# Patient Record
Sex: Female | Born: 1955 | Race: White | Hispanic: No | Marital: Married
Health system: Midwestern US, Academic
[De-identification: ages and names within clinical notes are randomized; demographics above are authoritative.]

---

## 2021-05-21 ENCOUNTER — Encounter: Admit: 2021-05-21 | Discharge: 2021-05-21 | Payer: MEDICARE

## 2021-05-22 ENCOUNTER — Encounter: Admit: 2021-05-22 | Discharge: 2021-05-22 | Payer: MEDICARE

## 2021-05-22 DIAGNOSIS — D649 Anemia, unspecified: Secondary | ICD-10-CM

## 2021-05-22 DIAGNOSIS — R161 Splenomegaly, not elsewhere classified: Secondary | ICD-10-CM

## 2021-05-22 DIAGNOSIS — D72829 Elevated white blood cell count, unspecified: Secondary | ICD-10-CM

## 2021-05-22 DIAGNOSIS — D61818 Other pancytopenia: Secondary | ICD-10-CM

## 2021-05-22 DIAGNOSIS — R634 Abnormal weight loss: Secondary | ICD-10-CM

## 2021-05-22 DIAGNOSIS — R61 Generalized hyperhidrosis: Secondary | ICD-10-CM

## 2021-05-22 NOTE — Telephone Encounter
Orders for CBC + diff, peripheral smear and BCR/ABL p210 faxed to [facility] lab. Fax [phone]. Received successful completion report.     Later received message from lab that they need a form signed by provider and faxed back to process the peripheral smear. This will be given to provider on Monday 05/25/21 as he is out of office today and they are aware.

## 2021-05-25 ENCOUNTER — Encounter: Admit: 2021-05-25 | Discharge: 2021-05-25 | Payer: MEDICARE

## 2021-05-25 DIAGNOSIS — D61818 Other pancytopenia: Secondary | ICD-10-CM

## 2021-05-25 DIAGNOSIS — D7589 Other specified diseases of blood and blood-forming organs: Secondary | ICD-10-CM

## 2021-05-25 DIAGNOSIS — R61 Generalized hyperhidrosis: Secondary | ICD-10-CM

## 2021-05-25 DIAGNOSIS — R161 Splenomegaly, not elsewhere classified: Secondary | ICD-10-CM

## 2021-05-25 DIAGNOSIS — R69 Illness, unspecified: Secondary | ICD-10-CM

## 2021-05-25 DIAGNOSIS — R634 Abnormal weight loss: Secondary | ICD-10-CM

## 2021-05-25 NOTE — Telephone Encounter
05/22/21- Per Dr Jacqualyn Posey I asked pt if she would be agreeable to drawing labs before coming to [place]. She was agreeable and requested they be sent to [facility]. Dawn Brenson ordered cbc, peripheral smear and bcr abl per Dr Jacqualyn Posey and pt had them drawn on 05/22/21   05/25/21: Per Dr Jacqualyn Posey I called [place] and spoke to [facility] lab tec and asked if BCR ABL could be canceled. She reported she called [facility] and they refused to cancel the lab. I called [facility] and spoke to a representative at [facility] who confirmed they can not canceled due to test being started on 7/10.

## 2021-05-26 ENCOUNTER — Encounter: Admit: 2021-05-26 | Discharge: 2021-05-26 | Payer: MEDICARE

## 2021-05-27 ENCOUNTER — Encounter: Admit: 2021-05-27 | Discharge: 2021-05-27 | Payer: MEDICARE

## 2021-05-27 DIAGNOSIS — D72829 Elevated white blood cell count, unspecified: Secondary | ICD-10-CM

## 2021-05-27 DIAGNOSIS — D61818 Other pancytopenia: Secondary | ICD-10-CM

## 2021-05-27 DIAGNOSIS — R61 Generalized hyperhidrosis: Secondary | ICD-10-CM

## 2021-05-27 DIAGNOSIS — R161 Splenomegaly, not elsewhere classified: Secondary | ICD-10-CM

## 2021-05-27 DIAGNOSIS — R634 Abnormal weight loss: Secondary | ICD-10-CM

## 2021-05-27 DIAGNOSIS — D649 Anemia, unspecified: Secondary | ICD-10-CM

## 2021-05-28 ENCOUNTER — Encounter: Admit: 2021-05-28 | Discharge: 2021-05-28 | Payer: MEDICARE

## 2021-05-28 DIAGNOSIS — R634 Abnormal weight loss: Secondary | ICD-10-CM

## 2021-05-28 DIAGNOSIS — D61818 Other pancytopenia: Secondary | ICD-10-CM

## 2021-05-28 DIAGNOSIS — D649 Anemia, unspecified: Secondary | ICD-10-CM

## 2021-05-28 DIAGNOSIS — R161 Splenomegaly, not elsewhere classified: Secondary | ICD-10-CM

## 2021-05-28 DIAGNOSIS — D72829 Elevated white blood cell count, unspecified: Secondary | ICD-10-CM

## 2021-05-28 DIAGNOSIS — R61 Generalized hyperhidrosis: Secondary | ICD-10-CM

## 2021-05-29 ENCOUNTER — Encounter: Admit: 2021-05-29 | Discharge: 2021-05-29 | Payer: MEDICARE

## 2021-05-29 DIAGNOSIS — R161 Splenomegaly, not elsewhere classified: Secondary | ICD-10-CM

## 2021-05-29 DIAGNOSIS — R61 Generalized hyperhidrosis: Secondary | ICD-10-CM

## 2021-05-29 DIAGNOSIS — R634 Abnormal weight loss: Secondary | ICD-10-CM

## 2021-05-29 DIAGNOSIS — D72829 Elevated white blood cell count, unspecified: Secondary | ICD-10-CM

## 2021-05-29 DIAGNOSIS — D61818 Other pancytopenia: Secondary | ICD-10-CM

## 2021-05-29 DIAGNOSIS — D649 Anemia, unspecified: Secondary | ICD-10-CM

## 2021-05-31 NOTE — Progress Notes
Name: Crystal Cameron          MRN: [id number]      DOB: [birth year]      AGE: 65 y.o.   DATE OF SERVICE: 06/03/2021    Subjective:             Reason for Visit:  No chief complaint on file.      Crystal Cameron is a 65 y.o. female.       Crystal Cameron is here for consultation regarding her splenomegaly and cytopenia.    Crystal Cameron is here with her husband Crystal Cameron.  They had noticed when he put her arm across her abdomen that he thought he felt a mass.  They gone to see the doctor.  I CAT scan in July 1 showed the spleen of 620 x 19 cm.  The liver looked normal.  There is no adenopathy.  Lab work about that same time showed a hemoglobin 11.2, platelets of 99, and ANC of 2300 and lymphocytopenia and a white count of 2.8.  The patient has been trying to lose weight and maybe lost 10 pounds in the last 4 months.  She does have some sweats at night but that does not psych that is necessarily new.  She is not aware of any skin rashes.    The patient has a history of skin cancer and also generalized anxiety disorder.    The patient is originally from around [facility].  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    Explained my concern to the patient and her husband that this might represent a lymphoproliferative sorter or a myeloproliferative disorder.  I suggested we arrange for a bone marrow biopsy with conscious sedation.  The patient's mother died from T-cell lymphoma at an older age.  She also had another family member that had Hodgkin's lymphoma at an older age.    The patient's past medical, surgical, social, and family histories were reviewed with the patient at this visit. I also reviewed the recent laboratory, pathology, and radiological studies.    We will arrange for a bone marrow biopsy with conscious sedation here at [place].  We will then get back together about 3 weeks after that.  They do not have the best cell service and we told her we will try to call them the results of the test as they trickle and.  Answered a number of other questions for the patient and her husband.           Review of Systems   Constitutional: Positive for diaphoresis, fatigue and unexpected weight change. Negative for chills and fever.   HENT: Negative for nosebleeds.    Eyes: Negative for pain and visual disturbance.   Respiratory: Negative for cough and shortness of breath.    Cardiovascular: Negative for chest pain and leg swelling.   Gastrointestinal: Negative for abdominal pain.   Genitourinary: Negative for hematuria.   Musculoskeletal: Negative for joint swelling and neck pain.   Skin: Negative for rash.   Neurological: Negative for dizziness and weakness.   Hematological: Negative for adenopathy.   Psychiatric/Behavioral: Negative for confusion.         Objective:           No current outpatient medications on file.       There were no vitals filed for this visit.  There  is no height or weight on file to calculate BMI.     No past medical history on file.  No past surgical history on file.      No family history on file.                   Pain Addressed:  N/A    Patient Evaluated for a Clinical Trial: No treatment clinical trial available for this patient.     [place] Cooperative Oncology Group performance status is 0, Fully active, able to carry on all pre-disease performance without restriction.Marland Kitchen     Physical Exam  Vitals reviewed.   Constitutional:       General: She is not in acute distress.     Appearance: Normal appearance. She is well-developed and normal weight. She is not ill-appearing.   HENT:      Head: Normocephalic and atraumatic.   Eyes:      Conjunctiva/sclera: Conjunctivae normal.   Neck:      Vascular: No JVD.      Trachea: No tracheal deviation.   Cardiovascular:      Rate and Rhythm: Normal rate and regular rhythm.      Heart sounds:     No friction rub. No gallop.   Pulmonary:      Effort: Pulmonary effort is normal. No respiratory distress.      Breath sounds: Normal breath sounds. No stridor. No wheezing or rales.   Chest:   Breasts:      Right: No supraclavicular adenopathy.      Left: No supraclavicular adenopathy.       Abdominal:      General: There is no distension.      Palpations: Abdomen is soft. There is mass (Spleen).      Tenderness: There is no abdominal tenderness.       Musculoskeletal:         General: Normal range of motion.      Cervical back: Normal range of motion. No rigidity.      Right lower leg: No edema.      Left lower leg: No edema.   Lymphadenopathy:      Head:      Right side of head: No submental or submandibular adenopathy.      Left side of head: No submental or submandibular adenopathy.      Cervical: No cervical adenopathy.      Right cervical: No superficial or posterior cervical adenopathy.     Left cervical: No superficial or posterior cervical adenopathy.      Upper Body:      Right upper body: No supraclavicular or epitrochlear adenopathy.      Left upper body: No supraclavicular or epitrochlear adenopathy.   Skin:     General: Skin is warm and dry.      Findings: No bruising, lesion or rash.   Neurological:      Mental Status: She is alert and oriented to person, place, and time.      Cranial Nerves: No cranial nerve deficit.      Motor: No weakness.      Gait: Gait normal.   Psychiatric:         Mood and Affect: Mood normal.         Behavior: Behavior normal.         Thought Content: Thought content normal.         Judgment: Judgment normal.  Assessment and Plan:  Patient Active Problem List    Diagnosis Date Noted   ? Splenomegaly 05/26/2021     Priority: Medium     05/14/2021 [facility] laboratory creatinine 1.1, transaminases alk phos total bili normal, TSH 1.1, W2.8 low, H11.2 Below 12, MCV 82.1, platelets 99 low, ANC 2300, lymphocytes 300 slightly low,  05/15/2021 [facility] in [place] CT abdomen pelvis liver is normal sign.  Spleen is massively enlarged measuring 20 cm x 19 cm.  No focal splenic lesions.  No adenopathy.  Diverticulosis throughout the sigmoid colon.  05/20/2021 [facility] medical clinic, [place].  65 year old female to review CAT scan that showed massive splenomegaly.  Lab work showed pancytopenia.  Patient says she has lost weight maybe 55 pounds since last fall.  Also reports weight loss and night sweats.  05/20/2021 [facility] BCR/ABL 1 and 2 not detected  05/22/2021 osl showed W2.3, H10.5, MCV 82, platelets 129, MPV 11.2 slightly elevated, 78% neutrophils  05/29/2021 [facility] peripheral smear pancytopenia.  06/03/2021 this is patient's first visit to [facility] office.  Reviewed imaging showing enlarged spleen along with exam feeling palpable spleen.  Talked about peripheral smear not being helpful.  We will arrange for bone marrow biopsy with conscious sedation and follow-up about 3 weeks later.  She is aware that I am concerned about possible lymphoproliferative sorter or myeloproliferative disorder.  Her mother died from a T-cell lymphoma at an older age.  Her mother also had melanoma.      PMH: Generalized anxiety disorder    SH: Patient and husband both from the same small town I believe at [facility].  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                                       ? Zoster without complications 05/26/2021   ? Other specified anxiety disorders 05/26/2021     This note was created with partial dictation using a dragon dictation system, please notify us if you notice any errors of omissions or content.

## 2021-06-03 ENCOUNTER — Encounter: Admit: 2021-06-03 | Discharge: 2021-06-03 | Payer: MEDICARE

## 2021-06-03 DIAGNOSIS — H547 Unspecified visual loss: Secondary | ICD-10-CM

## 2021-06-03 DIAGNOSIS — C801 Malignant (primary) neoplasm, unspecified: Secondary | ICD-10-CM

## 2021-06-03 DIAGNOSIS — R161 Splenomegaly, not elsewhere classified: Secondary | ICD-10-CM

## 2021-06-03 DIAGNOSIS — D759 Disease of blood and blood-forming organs, unspecified: Secondary | ICD-10-CM

## 2021-06-03 DIAGNOSIS — C4490 Unspecified malignant neoplasm of skin, unspecified: Secondary | ICD-10-CM

## 2021-06-04 ENCOUNTER — Encounter: Admit: 2021-06-04 | Discharge: 2021-06-04 | Payer: MEDICARE

## 2021-06-04 DIAGNOSIS — C801 Malignant (primary) neoplasm, unspecified: Secondary | ICD-10-CM

## 2021-06-04 DIAGNOSIS — H547 Unspecified visual loss: Secondary | ICD-10-CM

## 2021-06-04 DIAGNOSIS — C4490 Unspecified malignant neoplasm of skin, unspecified: Secondary | ICD-10-CM

## 2021-06-04 NOTE — Patient Education
Dear Mrs Kathlene November,    Thank you for choosing [facility] Interventional Radiology for your procedure. Your appointment information is listed below:    Appointment Date: 06/09/21  Appointment Time: 10AM  Arrival Time: 9AM  Location:     ? [facility]: [address], [place], [place] [address]   Parking: available in the front of the building      INTERVENTIONAL RADIOLOGY  PRE-PROCEDURE INSTRUCTIONS SEDATION    You are scheduled for a procedure in Interventional Radiology with procedural sedation.  Please follow these instructions and any direction from your Primary Care/Managing Physician.  If you have questions about your procedure or need to reschedule please call [phone].    Medication Instructions:   You may take the following medications with a small sip of water:  Continue taking your normal morning medications as directed.        Diet Instructions:  a. (8) hours 2am before your procedure, stop your regular diet and start a clear liquid diet.  b. (2) hours 8am before your procedure discontinue clear liquids.  You should have nothing by mouth. This includes GUM or CANDY.     Clear Liquid Diet    Water  Apple or White Grape Juice  Coffee or tea without cream   Tea  White Cranberry Juice  Chicken Bouillon or Broth (no noodles)  Soda Pop  Popsicles   Beef Bouillon or Broth (no noodles)    Day of Exam Instructions:  1. Bathe or shower with an antibacterial soap prior to your appointment.  2. If you have a history of Obstructive Sleep Apnea (OSA) bring your CPAP/BIPAP.   3. Bring a list of your current medications and the dosages.  4. Wear comfortable clothing and leave valuables at home.  5. Arrive (1) hour prior to your appointment.  This time will be spent registering, interviewing, assessing, educating and preparing you for the test.  ? You will be with [place] anywhere from 30 minutes to 6 hours after your exam depending on your procedure.  6. You will be sedated for the procedure. A responsible adult must drive you home (no Benedetto Goad, taxis or buses are allowed) and stay with you overnight. If you do not have a driver we will be unable to perform your procedure.   7. You will not be able to return to work or drive the same day if receiving sedation.

## 2021-06-04 NOTE — Progress Notes
Interventional Radiology Outpatient Scheduling Checklist      1.  Name of Procedure(s):   Bone Marrow Biopsy      2.  Date of Procedure:   06/09/21      3.  Arrival Time:   9am      4.  Procedure Time:  10am      5.  Correct Procedural Room Assignment:  [facility]      6.  Blood Thinners Triaged and instructed per protocol: Y/N/NA:  NA  Confirmed accurate instructions sent to patient: Y/N:  NA       7.  Procedure Order Verified: Y/N:  Yes      9.  Patient instructed to have a driver: Y/N/NA:  Yes    10.  Patient instructed on NPO status: Y/N/NA:  Yes  Confirmed accurate instructions sent to patient: Y/N:  Yes    11.  Specimen needed: Y/N/NA:  Yes   Verified Order placed: Y/N:  Yes    12.  Allergies Verified:  Y/N:  Yes    13.  Is there an Iodine Allergy: Y/N:  No  Does the Procedure Require contrast: Y/N:  N  If so, was the IR- Contrast Allergy Pre-Procedure Medication protocol ordered: Y/NA:  NA    14.  Does the patient have labs according to IR Pre-procedure Laboratory Parameter policy: Y/N/NA:  Yes  If No, was the patient instructed to obtain labs prior to procedure: Y/N/NA:  NA     15.  Will the patient need to be admitted or have a possible admission: Y/N:  No  If yes, confirmed accurate instructions sent to patient: Y/N/NA:  NA     16.  Patient States Understanding: Y/N:  Yes    17.  History of OSA:  Y/N:  No  If yes, confirm request to bring CPAP sent to patient: Y/N/NA:  NA    18. Does the patient have an insulin pump or continuous glucose monitor? NA   If yes, was the patient instructed that this will need to be removed for this procedure and to bring supplies to reapply once the procedure is complete? Y/N    19. Patient was sent electronic procedure instructions: Y/N:  Yes

## 2021-06-05 ENCOUNTER — Encounter: Admit: 2021-06-05 | Discharge: 2021-06-05 | Payer: MEDICARE

## 2021-06-06 NOTE — Telephone Encounter
Patient called to let us know she is scheduled on Tuesday 06/09/21 at 0900 for bone marrow biopsy so thinks we are on track.

## 2021-06-09 ENCOUNTER — Encounter: Admit: 2021-06-09 | Discharge: 2021-06-09 | Payer: MEDICARE

## 2021-06-09 ENCOUNTER — Ambulatory Visit: Admit: 2021-06-09 | Discharge: 2021-06-09 | Payer: MEDICARE

## 2021-06-09 DIAGNOSIS — D759 Disease of blood and blood-forming organs, unspecified: Secondary | ICD-10-CM

## 2021-06-09 DIAGNOSIS — C801 Malignant (primary) neoplasm, unspecified: Secondary | ICD-10-CM

## 2021-06-09 DIAGNOSIS — H547 Unspecified visual loss: Secondary | ICD-10-CM

## 2021-06-09 DIAGNOSIS — R161 Splenomegaly, not elsewhere classified: Principal | ICD-10-CM

## 2021-06-09 DIAGNOSIS — C4490 Unspecified malignant neoplasm of skin, unspecified: Secondary | ICD-10-CM

## 2021-06-09 LAB — MANUAL DIFF
EOSINOPHIL %: 3 % (ref 0–5)
LYMPHOCYTES: 22 % — ABNORMAL LOW (ref 24–44)
MONOCYTES %: 2 % — ABNORMAL LOW (ref 4–12)
NEUTROPHILS,SEG: 73 % — ABNORMAL LOW (ref 41–77)

## 2021-06-09 LAB — CBC W/DIFF BM
HEMATOCRIT: 32 % — ABNORMAL LOW (ref 36–45)
HEMOGLOBIN: 10 g/dL — ABNORMAL LOW (ref 12.0–15.0)
MCH: 25 pg — ABNORMAL LOW (ref 26–34)
MCHC: 32 g/dL (ref 32.0–36.0)
MCV: 78 FL — ABNORMAL LOW (ref 80–100)
MPV: 9.4 FL (ref 7–11)
PLATELET COUNT: 85 K/UL — ABNORMAL LOW (ref 150–400)
RBC COUNT: 4 M/UL (ref 4.0–5.0)
RDW: 17 % — ABNORMAL HIGH (ref 11–15)
WBC COUNT: 1.8 K/UL — ABNORMAL LOW (ref 4.5–11.0)

## 2021-06-09 MED ORDER — SODIUM CHLORIDE 0.9 % IV SOLP
0 refills | Status: CP
Start: 2021-06-09 — End: ?
  Administered 2021-06-09: 15:00:00 500 mL via INTRAVENOUS

## 2021-06-09 MED ORDER — ONDANSETRON HCL (PF) 4 MG/2 ML IJ SOLN
4 mg | Freq: Once | INTRAVENOUS | 0 refills | Status: CP
Start: 2021-06-09 — End: ?
  Administered 2021-06-09: 15:00:00 4 mg via INTRAVENOUS

## 2021-06-09 MED ORDER — MIDAZOLAM 1 MG/ML IJ SOLN
1 mg | Freq: Once | INTRAVENOUS | 0 refills | Status: CP
Start: 2021-06-09 — End: ?
  Administered 2021-06-09: 15:00:00 1 mg via INTRAVENOUS

## 2021-06-09 MED ORDER — PROCHLORPERAZINE EDISYLATE 5 MG/ML IJ SOLN
10 mg | Freq: Once | INTRAVENOUS | 0 refills | Status: CP
Start: 2021-06-09 — End: ?
  Administered 2021-06-09: 16:00:00 10 mg via INTRAVENOUS

## 2021-06-09 MED ORDER — FENTANYL CITRATE (PF) 50 MCG/ML IJ SOLN
0 refills | Status: CP
Start: 2021-06-09 — End: ?
  Administered 2021-06-09: 15:00:00 50 ug via INTRAVENOUS

## 2021-06-09 MED ORDER — MIDAZOLAM 1 MG/ML IJ SOLN
0 refills | Status: CP
Start: 2021-06-09 — End: ?
  Administered 2021-06-09: 15:00:00 1 mg via INTRAVENOUS

## 2021-06-09 NOTE — Other
Immediate Post Procedure Note    Date:  06/09/2021                                         Attending Physician:   Derrek Gu    Procedure(s):  Bone marrow biopsy  Pre/Post Diagnosis:  cytopenia  Description/Findings:  Core specimens  Anesthesia:  2% lidocaine  Versed IV, fentanyl IV    Time out performed: Consent obtained, correct patient verified, correct procedure verified, correct site verified, patient marked as necessary.  Estimated Blood Loss:  None/Negligible  Specimen(s) Removed/Disposition:  Yes, sent to pathology  Complications: None    Kathlynn Grate, MD

## 2021-06-09 NOTE — Progress Notes
Sedation physician present in room.  Recent vitals and patient condition reviewed between sedating physician and nurse.  Reassessment completed.  Determination made to proceed with planned sedation.

## 2021-06-09 NOTE — H&P (View-Only)
Pre Procedure History and Physical/Sedation Plan    Procedure Date: 06/09/2021     Planned Procedure(s):  Bone marrow biopsy  Indication:  cytopenia  Sedation/Medication Plan: Moderate sedation  Discussion/Reviews:  Physician has discussed risks and alternatives of this type of sedation and above planned procedures with patient  Notes:  none  __________________________________________________________________    Active Problems:    * No active hospital problems. *      Chief Complaint:  Cytopenia    History of Present Illness: Crystal Cameron is a 65 y.o. female with cytopenia    Previous Anesthetic/Sedation History:  Denies previous adverse events    Nursing Medical History     Nursing Surgical History     Pertinent medical/surgical history reviewed  Social History     Socioeconomic History    Marital status: Married   Tobacco Use    Smoking status: Never Smoker    Smokeless tobacco: Never Used   Brewing technologist Use: Never used   Substance and Sexual Activity    Alcohol use: Never    Drug use: Never     Family history reviewed; non-contributory  Allergies:  Vicodin [hydrocodone-acetaminophen]  Medications:  No current facility-administered medications for this encounter.     Review of Systems:  Pertinent as noted per HPI    Physical Exam:  Temp: 37 C (98.6 F) (07/26 0926)  Pulse: 79 (07/26 0926)  Respirations: 16 PER MINUTE (07/26 0926)  BP: 133/79 (07/26 0926)  General:  Alert, cooperative, no distress, appears stated age    Airway:  airway assessment performed  Mallampati II (soft palate, uvula, fauces visible)  Anesthesia Classification:  ASA II (A normal patient with mild systemic disease)    Lab/Radiology/Other Diagnostic Tests:  Labs:  Pertinent labs reviewed  CXR:  Not obtained  Consults: Not obtained    Kathlynn Grate, MD

## 2021-06-12 ENCOUNTER — Encounter: Admit: 2021-06-12 | Discharge: 2021-06-12 | Payer: MEDICARE

## 2021-06-12 DIAGNOSIS — C8580 Other specified types of non-Hodgkin lymphoma, unspecified site: Secondary | ICD-10-CM

## 2021-06-12 DIAGNOSIS — R161 Splenomegaly, not elsewhere classified: Secondary | ICD-10-CM

## 2021-06-12 DIAGNOSIS — D61818 Other pancytopenia: Secondary | ICD-10-CM

## 2021-06-12 DIAGNOSIS — C8597 Non-Hodgkin lymphoma, unspecified, spleen: Secondary | ICD-10-CM

## 2021-06-12 NOTE — Telephone Encounter
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    I have suggested that we arrange for a PET scan to see the extent of her disease.    I talked about potential treatments with Bendamustine rituximab given every 4 weeks for 6 cycles.  I also talked about the possibility rituximab single agent.  I also mentioned the possibility of a B TK inhibitor.    I suggested to the patient that we arrange for the PET scan.  I will also talk with my partners next week about the current treatment of splenic marginal zone lymphoma.  We are trying to balance the desire to improve her blood counts so that her risk for infection will be decreased.    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  We talked about the risk for infection.  She is fully vaccinated and boosted.  She may not substitute beginning of year to we see how her counts improve.  We will let her know the results of PET scan and may talk by phone about teaching for appropriate therapy.  She will write down and ask [place] questions if they arise.

## 2021-06-17 ENCOUNTER — Encounter: Admit: 2021-06-17 | Discharge: 2021-06-17 | Payer: MEDICARE

## 2021-06-17 NOTE — Telephone Encounter
Patient called to see if she needs to come for her follow up 06/25/2021 since Dr Jacqualyn Posey called her with her bone marrow biopsy results. Dr Jacqualyn Posey agreed to cancel that appointment since we are waiting for her PET results 07/01/2021. Patient requests that her PET be moved up if at all possible. Will have scheduling look into moving the PET up if possible. Patient appreciative.

## 2021-06-25 ENCOUNTER — Encounter: Admit: 2021-06-25 | Discharge: 2021-06-25 | Payer: MEDICARE

## 2021-06-25 DIAGNOSIS — C8597 Non-Hodgkin lymphoma, unspecified, spleen: Secondary | ICD-10-CM

## 2021-06-25 DIAGNOSIS — C8580 Other specified types of non-Hodgkin lymphoma, unspecified site: Secondary | ICD-10-CM

## 2021-06-25 DIAGNOSIS — R161 Splenomegaly, not elsewhere classified: Secondary | ICD-10-CM

## 2021-06-25 LAB — POC GLUCOSE: POC GLUCOSE: 96 mg/dL (ref 70–100)

## 2021-06-25 MED ORDER — RP DX F-18 FDG MCI
15 | Freq: Once | INTRAVENOUS | 0 refills | Status: CP
Start: 2021-06-25 — End: ?
  Administered 2021-06-25: 16:00:00 17.9 via INTRAVENOUS

## 2021-06-26 ENCOUNTER — Encounter: Admit: 2021-06-26 | Discharge: 2021-06-26 | Payer: MEDICARE

## 2021-06-26 DIAGNOSIS — D759 Disease of blood and blood-forming organs, unspecified: Secondary | ICD-10-CM

## 2021-06-26 DIAGNOSIS — C8580 Other specified types of non-Hodgkin lymphoma, unspecified site: Secondary | ICD-10-CM

## 2021-06-26 DIAGNOSIS — R161 Splenomegaly, not elsewhere classified: Secondary | ICD-10-CM

## 2021-06-26 NOTE — Telephone Encounter
Lab orders faxed to [facility] clinic. Fax [phone]. Received confirmation of successful fax transmission. RN contacted [place] to let her know orders sent and to contact the medical clinic to set up lab draw. V/u. RN contacted [facility], confirmed they had received lab orders.

## 2021-07-02 ENCOUNTER — Encounter: Admit: 2021-07-02 | Discharge: 2021-07-02 | Payer: MEDICARE

## 2021-07-02 DIAGNOSIS — R161 Splenomegaly, not elsewhere classified: Secondary | ICD-10-CM

## 2021-07-02 DIAGNOSIS — D759 Disease of blood and blood-forming organs, unspecified: Secondary | ICD-10-CM

## 2021-07-02 DIAGNOSIS — C8580 Other specified types of non-Hodgkin lymphoma, unspecified site: Secondary | ICD-10-CM

## 2021-07-03 ENCOUNTER — Encounter: Admit: 2021-07-03 | Discharge: 2021-07-03 | Payer: MEDICARE

## 2021-07-03 NOTE — Telephone Encounter
Patient called to check to see if we had received results of her hepatitis testing. All tests have been reviewed and are negative. Notified Makiyla of results and that treatment plan would be entered, once cleared by insurance, will be scheduled for education and treatment. She notes she is having fatigue, and is anxious to get treatment started.

## 2021-07-08 ENCOUNTER — Encounter: Admit: 2021-07-08 | Discharge: 2021-07-08 | Payer: MEDICARE

## 2021-07-08 MED ORDER — ACETAMINOPHEN 325 MG PO TAB
650 mg | Freq: Once | ORAL | 0 refills
Start: 2021-07-08 — End: ?

## 2021-07-08 MED ORDER — RITUXIMAB-PVVR IVPB
375 mg/m2 | Freq: Once | INTRAVENOUS | 0 refills
Start: 2021-07-08 — End: ?

## 2021-07-08 MED ORDER — DIPHENHYDRAMINE HCL 25 MG PO CAP
25 mg | Freq: Once | ORAL | 0 refills
Start: 2021-07-08 — End: ?

## 2021-07-08 MED ORDER — MEPERIDINE (PF) 25 MG/ML IJ SYRG
25 mg | INTRAVENOUS | 0 refills | PRN
Start: 2021-07-08 — End: ?

## 2021-07-08 NOTE — Progress Notes
this patient has marginal zone lymphoma    Patient Active Problem List    Diagnosis Date Noted   ? 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  Spleen is massively enlarged measuring 20 cm x 19 cm.  No focal splenic lesions.  No adenopathy.  Diverticulosis throughout the sigmoid colon.  05/20/2021 [facility] medical clinic, [place].  65 year old female to review CAT scan that showed massive splenomegaly.  Lab work showed pancytopenia.  Patient says she has lost weight maybe 55 pounds since last fall.  Also reports weight loss and night sweats.  05/20/2021 [facility] BCR/ABL 1 and 2 not detected  05/22/2021 osl showed W2.3, H10.5, MCV 82, platelets 129, MPV 11.2 slightly elevated, 78% neutrophils  05/29/2021 [facility] peripheral smear pancytopenia.  06/03/2021 this is patient's first visit to [facility] office.  Reviewed imaging showing enlarged spleen along with exam feeling palpable spleen.  Talked about peripheral smear not being helpful.  We will arrange for bone marrow biopsy with conscious sedation and follow-up about 3 weeks later.  She is aware that I am concerned about possible lymphoproliferative sorter or myeloproliferative disorder.  Her mother died from a T-cell lymphoma at an older age.  Her mother also had melanoma.  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  See comment.  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 - - - - consider PET and treatment with BR  06/12/2021 called and talked with patient about results of bone marrow showing marginal zone lymphoma.  Mention plans for PET scan.  Consider Bendamustine rituximab given every 4 weeks x 6 cycles.  Talked about risk for infection due to marginal zone lymphoma and pancytopenia but also risk of infection with both rituximab lymphocyte depletion as well as potential neutropenia with Bendamustine.  We will also do more think about whether single agent rituximab versus BR versus B TK inhibitor would be appropriate.  We will call patient after PET scan and arrange for teaching of appropriate therapy.  Patient will ask questions.  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  06/25/2021 [facility] PET scan spleen a megaly with diffuse FDG uptake maximal SUV of 6.6 measuring 21 cm previously 20 cm.  No hypermetabolic lymphadenopathy.  06/25/2021 tried reaching patient.  No answer.  We will check to see if she has had HCV testing.  We will also check HBV testing.  If HCV positive might consider antiretroviral therapy.  If negative would consider rituximab single agent.  06/30/2021 [facility] hepatitis C virus antibody 0.2, negative.  Hepatitis B surface antigen screen negative, hepatitis B core antibody screen negative  07/06/2021 orders entered for Bendamustine rituximab with plans for 6 cycles depending upon tolerance and counts.    PMH: Generalized anxiety disorder    SH: Patient and husband both from the same small town I believe at [facility].  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                                       ?  Pancytopenia (HCC) 06/12/2021     Priority: Low     06/12/2021 likely related to bone marrow involvement by marginal zone lymphoma.     ? Zoster without complications 05/26/2021   ? Other specified anxiety disorders 05/26/2021     This note was created with partial dictation using a dragon dictation system, please notify us if you notice any errors of omissions or content.

## 2021-07-10 ENCOUNTER — Encounter: Admit: 2021-07-10 | Discharge: 2021-07-10 | Payer: MEDICARE

## 2021-07-10 NOTE — Telephone Encounter
Patient called as she had missed a call from our office. Confirmed she was aware of upcoming appointments. Discussed that we are trying to get her scheduled for vaccinations d/t her enlarged spleen. Found that her local clinic does not have at least one of the necessary vaccines. They recommended the health department in [facility]. Ph [phone] they are closed on Fridays.       RN spoke with Malachy Mood. She thinks she had a pneumococcal vaccine about 5 years ago, but does not have record of it. Have attempted to contact her PCP office and had to leave a message. We discussed that she may have to get vaccinations here and is willing to do so.

## 2021-07-13 ENCOUNTER — Encounter: Admit: 2021-07-13 | Discharge: 2021-07-13 | Payer: MEDICARE

## 2021-07-13 MED ORDER — MENING A CONJ VACC,2 OF 2 (PF) 10 MCG /0.5 ML (FINAL) IM SOLR
.5 mL | Freq: Once | INTRAMUSCULAR | 0 refills
Start: 2021-07-13 — End: ?

## 2021-07-13 MED ORDER — HIB CONJ VACC (ACTHIB) DUAL COMPONENT INJ
Freq: Once | INTRAMUSCULAR | 0 refills | Status: CN
Start: 2021-07-13 — End: ?

## 2021-07-13 MED ORDER — MENIN C,Y,W-135 VAC,1 OF 2(PF) 5 MCG X 3/ 0.5 ML (FINAL) IM SOLR
1 | Freq: Once | INTRAMUSCULAR | 0 refills | Status: CN
Start: 2021-07-13 — End: ?

## 2021-07-13 MED ORDER — HIB CONJ VACC (ACTHIB) DUAL COMPONENT INJ
Freq: Once | INTRAMUSCULAR | 0 refills
Start: 2021-07-13 — End: ?

## 2021-07-13 MED ORDER — MENIN C,Y,W-135 VAC,1 OF 2(PF) 5 MCG X 3/ 0.5 ML (FINAL) IM SOLR
1 | Freq: Once | INTRAMUSCULAR | 0 refills
Start: 2021-07-13 — End: ?

## 2021-07-13 MED ORDER — MENINGOCOCCAL B VACCINE,4-COMP 50-50-50-25 MCG/0.5 ML IM SYRG
.5 mL | Freq: Once | INTRAMUSCULAR | 0 refills | Status: CN
Start: 2021-07-13 — End: ?

## 2021-07-13 MED ORDER — PNEUMOC 20-VAL CONJ-DIP CR(PF) 0.5 ML IM SYRG
.5 mL | Freq: Once | INTRAMUSCULAR | 0 refills | Status: CN
Start: 2021-07-13 — End: ?

## 2021-07-13 MED ORDER — MENING A CONJ VACC,2 OF 2 (PF) 10 MCG /0.5 ML (FINAL) IM SOLR
.5 mL | Freq: Once | INTRAMUSCULAR | 0 refills | Status: CN
Start: 2021-07-13 — End: ?

## 2021-07-13 MED ORDER — PNEUMOC 20-VAL CONJ-DIP CR(PF) 0.5 ML IM SYRG
.5 mL | Freq: Once | INTRAMUSCULAR | 0 refills
Start: 2021-07-13 — End: ?

## 2021-07-13 MED ORDER — MENINGOCOCCAL B VACCINE,4-COMP 50-50-50-25 MCG/0.5 ML IM SYRG
.5 mL | Freq: Once | INTRAMUSCULAR | 0 refills
Start: 2021-07-13 — End: ?

## 2021-07-14 ENCOUNTER — Encounter: Admit: 2021-07-14 | Discharge: 2021-07-14 | Payer: MEDICARE

## 2021-07-14 NOTE — Telephone Encounter
RN contacted patient to notify her that we have her scheduled for pre-splenectomy vaccinations potentially tomorrow after her education appointment. Patient wanted to ensure that it was ok for her to receive them just prior to Rituxan. RN requested pharmacy input regarding this. Patient plans to stay overnight in town following her education appointment d/t distance from home.

## 2021-07-15 ENCOUNTER — Encounter: Admit: 2021-07-15 | Discharge: 2021-07-15 | Payer: MEDICARE

## 2021-07-15 DIAGNOSIS — C8580 Other specified types of non-Hodgkin lymphoma, unspecified site: Secondary | ICD-10-CM

## 2021-07-15 DIAGNOSIS — H547 Unspecified visual loss: Secondary | ICD-10-CM

## 2021-07-15 DIAGNOSIS — C4490 Unspecified malignant neoplasm of skin, unspecified: Secondary | ICD-10-CM

## 2021-07-15 DIAGNOSIS — C801 Malignant (primary) neoplasm, unspecified: Secondary | ICD-10-CM

## 2021-07-15 MED ORDER — PNEUMOC 20-VAL CONJ-DIP CR(PF) 0.5 ML IM SYRG
.5 mL | Freq: Once | INTRAMUSCULAR | 0 refills | Status: CP
Start: 2021-07-15 — End: ?
  Administered 2021-07-15: 20:00:00 0.5 mL via INTRAMUSCULAR

## 2021-07-15 MED ORDER — MENIN C,Y,W-135 VAC,1 OF 2(PF) 5 MCG X 3/ 0.5 ML (FINAL) IM SOLR
1 | Freq: Once | INTRAMUSCULAR | 0 refills | Status: CP
Start: 2021-07-15 — End: ?

## 2021-07-15 MED ORDER — MENING A CONJ VACC,2 OF 2 (PF) 10 MCG /0.5 ML (FINAL) IM SOLR
.5 mL | Freq: Once | INTRAMUSCULAR | 0 refills | Status: CP
Start: 2021-07-15 — End: ?
  Administered 2021-07-15: 20:00:00 0.5 mL via INTRAMUSCULAR

## 2021-07-15 MED ORDER — HIB CONJ VACC (ACTHIB) DUAL COMPONENT INJ
Freq: Once | INTRAMUSCULAR | 0 refills | Status: CP
Start: 2021-07-15 — End: ?
  Administered 2021-07-15: 20:00:00 0.500 mL via INTRAMUSCULAR

## 2021-07-15 MED ORDER — MENINGOCOCCAL B VACCINE,4-COMP 50-50-50-25 MCG/0.5 ML IM SYRG
.5 mL | Freq: Once | INTRAMUSCULAR | 0 refills | Status: CP
Start: 2021-07-15 — End: ?
  Administered 2021-07-15: 20:00:00 0.5 mL via INTRAMUSCULAR

## 2021-07-15 MED ORDER — ALLOPURINOL 300 MG PO TAB
300 mg | ORAL_TABLET | Freq: Every day | ORAL | 0 refills | 60.00000 days | Status: AC
Start: 2021-07-15 — End: ?

## 2021-07-15 NOTE — Progress Notes
Name: Crystal Cameron          MRN: [id number]      DOB: 1956/01/04      AGE: 65 y.o.   DATE OF SERVICE: 07/15/2021    Subjective:             Reason for Visit:  Follow Up      Crystal Cameron is a 65 y.o. female.     Cancer Staging  No matching staging information was found for the patient.    History of Present Illness   Crystal Cameron is here today for Rituxan education.  She felt a mass in her abdomen.  CAT scan on July 1 showed massive enlargement of the spleen to 20 x 19 cm.  The liver looked normal.  There was no adenopathy.  Lab work showed a hemoglobin 11.2, platelets of 99, ANC of 2300, lymphocytopenia and a white count of 2.8.  The patient has lost 10 pounds in the last 4 months.  She denies night sweats, rash, itching.      PET on 06/25/21 showed marked splenomegaly with diffuse hypermetabolism compatible with lymphoma.  No LAD.  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      She reports abdominal fullness, early satiety and fatigue.         Review of Systems   Constitutional: Positive for appetite change (early satiety.  No recent weight loss) and fatigue (varies). Negative for diaphoresis, fever and unexpected weight change.   Gastrointestinal: Positive for constipation.        Abdominal fullness     Genitourinary: Negative.    Musculoskeletal: Negative.    Skin: Negative.    Hematological: Negative.    Psychiatric/Behavioral: Negative.    All other systems reviewed and are negative.        Objective:         ? IBUPROFEN PO Take  by mouth as Needed.   ? KRILL OIL PO Take 350 mg by mouth daily.   ? lutein 20 mg tab Take 20 mg by mouth daily.   ? Multivitamins with Fluoride (MULTI-VITAMIN PO) Take  by mouth daily.   ? sertraline (ZOLOFT) 50 mg tablet Take 50 mg by mouth daily.     Vitals:    07/15/21 1353   BP: 121/58   BP Source: Arm, Right Upper   Pulse: 85   Temp: 36.9 ?C (98.5 ?F)   Resp: 16   SpO2: 99%   O2 Device: None (Room air)   TempSrc: Oral   PainSc: Zero Weight: 75.9 kg (167 lb 6.4 oz)   Height: 159 cm (5' 2.6)  Comment: with shoes     Body mass index is 30.04 kg/m?Marland Kitchen     Pain Score: Zero            Pain Addressed:  N/A    Patient Evaluated for a Clinical Trial: No treatment clinical trial available for this patient.     [place] Cooperative Oncology Group performance status is 0, Fully active, able to carry on all pre-disease performance without restriction.Marland Kitchen     Physical Exam  Vitals reviewed.   Constitutional:       Appearance: Normal appearance.   HENT:      Head: Normocephalic.   Eyes:      Extraocular Movements: Extraocular movements intact.   Cardiovascular:      Rate and Rhythm: Normal rate.   Pulmonary:      Effort: Pulmonary effort is  normal.   Abdominal:      General: There is distension.      Palpations: There is mass (spleen enlarged and extends across midline into R abdomen).      Tenderness: There is no abdominal tenderness.   Musculoskeletal:         General: Normal range of motion.      Cervical back: Normal range of motion.   Skin:     General: Skin is warm and dry.   Neurological:      General: No focal deficit present.      Mental Status: She is alert and oriented to person, place, and time.   Psychiatric:         Mood and Affect: Mood normal.         Behavior: Behavior normal.         Thought Content: Thought content normal.           Results for orders placed or performed during the hospital encounter of 07/15/21 (from the past 336 hour(s))   CBC AND DIFF   Result Value Ref Range    White Blood Cells 1.6 (L) 4.5 - 11.0 K/UL    RBC 4.04 4.0 - 5.0 M/UL    Hemoglobin 10.0 (L) 12.0 - 15.0 GM/DL    Hematocrit 16.1 (L) 36 - 45 %    MCV 78.1 (L) 80 - 100 FL    MCH 24.7 (L) 26 - 34 PG    MCHC 31.6 (L) 32.0 - 36.0 G/DL    RDW 09.6 (H) 11 - 15 %    Platelet Count 98 (L) 150 - 400 K/UL    MPV 8.3 7 - 11 FL    Neutrophils 66 41 - 77 %    Lymphocytes 27 24 - 44 %    Monocytes 5 4 - 12 %    Eosinophils 1 0 - 5 %    Basophils 1 0 - 2 %    Absolute Neutrophil Count 1.10 (L) 1.8 - 7.0 K/UL    Absolute Lymph Count 0.40 (L) 1.0 - 4.8 K/UL    Absolute Monocyte Count 0.10 0 - 0.80 K/UL    Absolute Eosinophil Count 0.00 0 - 0.45 K/UL    Absolute Basophil Count 0.00 0 - 0.20 K/UL       06/25/21 PET:  IMPRESSION   Marked splenomegaly with diffuse hypermetabolism compatible with lymphoma.     No hypermetabolic lymphadenopathy.     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 See comment.     Peripheral blood smear: Pancytopenia including mild microcytic anemia,   mild neutropenia, lymphopenia, and moderate thrombocytopenia.     Comment:   The morphologic and immunophenotypic findings favor marginal zone   lymphoma.     Cytogenetics:  No clonal abnormalities     Assessment and Plan:       Patient Active Problem List    Diagnosis Date Noted   ? Pancytopenia (HCC) 06/12/2021     06/12/2021 likely related to bone marrow involvement by marginal zone lymphoma.     ? Marginal zone lymphoma [facility]) 05/26/2021     05/14/2021 [facility] laboratory creatinine 1.1, transaminases alk phos total bili normal,  TSH 1.1, W2.8 low, H11.2 Below 12, MCV 82.1, platelets 99 low, ANC 2300, lymphocytes 300 slightly low,  05/15/2021 [facility] in [place] CT abdomen pelvis liver is normal sign.  Spleen is massively enlarged measuring 20 cm x 19 cm.  No focal splenic lesions.  No adenopathy.  Diverticulosis throughout the sigmoid colon.  05/20/2021 [facility] medical clinic, [place].  65 year old female to review CAT scan that showed massive splenomegaly.  Lab work showed pancytopenia.  Patient says she has lost weight maybe 55 pounds since last fall.  Also reports weight loss and night sweats.  05/20/2021 [facility] BCR/ABL 1 and 2 not detected  05/22/2021 osl showed W2.3, H10.5, MCV 82, platelets 129, MPV 11.2 slightly elevated, 78% neutrophils  05/29/2021 [facility] peripheral smear pancytopenia.  06/03/2021 this is patient's first visit to [facility] office.  Reviewed imaging showing enlarged spleen along with exam feeling palpable spleen.  Talked about peripheral smear not being helpful.  We will arrange for bone marrow biopsy with conscious sedation and follow-up about 3 weeks later.  She is aware that I am concerned about possible lymphoproliferative sorter or myeloproliferative disorder.  Her mother died from a T-cell lymphoma at an older age.  Her mother also had melanoma.  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  See comment.  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 - - - - consider PET and treatment with BR  06/12/2021 called and talked with patient about results of bone marrow showing marginal zone lymphoma.  Mention plans for PET scan.  Consider Bendamustine rituximab given every 4 weeks x 6 cycles.  Talked about risk for infection due to marginal zone lymphoma and pancytopenia but also risk of infection with both rituximab lymphocyte depletion as well as potential neutropenia with Bendamustine.  We will also do more think about whether single agent rituximab versus BR versus B TK inhibitor would be appropriate.  We will call patient after PET scan and arrange for teaching of appropriate therapy.  Patient will ask questions.  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  06/25/2021 [place] PET scan spleen a megaly with diffuse FDG uptake maximal SUV of 6.6 measuring 21 cm previously 20 cm.  No hypermetabolic lymphadenopathy.  06/25/2021 tried reaching patient.  No answer.  We will check to see if she has had HCV testing.  We will also check HBV testing.  If HCV positive might consider antiretroviral therapy.  If negative would consider rituximab single agent.  06/30/2021 [facility] hepatitis C virus antibody 0.2, negative.  Hepatitis B surface antigen screen negative, hepatitis B core antibody screen negative  07/06/2021 orders entered for Bendamustine rituximab with plans for 6 cycles depending upon tolerance and counts.  07/08/2021 discussed case with Dr. Mikey Bussing.  Will change treatment plan to rituximab 375 mg/metered squared for 4-8 weekly doses depending upon tolerance and benefit and if tolerated probably 4 additional doses at 71-month intervals or some other form of maintenance for 2 years.  This was discussed with the patient.  Orders will be entered.  We will also place orders for vaccines for Streptococcus, Neisseria meningitidis and haemophilus influenza.  07/15/21  Rituxan education.  Start tomorrow.  Pre-splenectomy vaccinations today.  Start allopurinol today.  1 week tox check.  FU with Dr. Madison Hickman in about 4 weeks.        PMH: Generalized anxiety disorder    SH: Patient and husband both from the same small town I believe at [facility].  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                  Marginal zone lymphoma.  APP Chemotherapy Education    IV Chemotherapy: The following is a summary of the patient's IV Chemotherapy Education.    A thorough pre-assessment and teaching session explaining the mechanism of action, possible side effects, precautions and instructions regarding rituxumab for control intent was conducted. The patient will return on 07/16/21 to initiate treatment. The cycle will repeat every 7 days for a total of 4-8 cycles cycles.   Imaging probably after C4.    Plan of administration was reviewed.      Both written and verbal information were given to the patient.    The planned course of treatment, anticipated benefits, material risks and potential side effects that may occur with this course of treatment were explained to the patient.    Reproductive concerns were not discussed with the patient  Infertility risks were not applicable and therefore not discussed    Appropriate handling of body secretions and waste at home were reviewed as applicable.    Prescriptions for supportive medications including allopurinol were e-scripted to their pharmacy and discussed in detail how to take. Drug to drug interactions were reviewed as applicable.     The patient has received contact information for the clinic and was instructed on when and who to call.     The patient verbalized understanding, was given the opportunity to ask questions, and the consent form was signed.     Follow up appointment with nurse practitioner in 1 week for toxicity check, Dr. Georgiann Hahn in about 4 weeks.    Lab weekly per treatment plan.    Pre-splenectomy vaccinations done today.  Discussed with pt that she can receive vaccines now and proceed w/ Rituxan tomorrow or wait and receive Rituxan in 2 weeks to allow for better immune response to vaccinations.  She can also forgo vaccinations now since she may or may not have splenectomy in the future.  She opted to proceed with vaccinations today and start treatment tomorrow as scheduled.      This was a 45 minute face to face encounter with 45 minutes spent in counseling and coordination of care.

## 2021-07-15 NOTE — Progress Notes
Patient arrived for vaccines. Pt given Prevnar20 and Meningococcal in R arm. ActHIB and MCV40 in L arm. Pt tolerated vaccines without issues. Pt denied any questions and will be at clinic tomorrow to start Rituximab. Pt discharged ambulatory.

## 2021-07-16 ENCOUNTER — Encounter: Admit: 2021-07-16 | Discharge: 2021-07-16 | Payer: MEDICARE

## 2021-07-16 DIAGNOSIS — H547 Unspecified visual loss: Secondary | ICD-10-CM

## 2021-07-16 DIAGNOSIS — C801 Malignant (primary) neoplasm, unspecified: Secondary | ICD-10-CM

## 2021-07-16 DIAGNOSIS — C4490 Unspecified malignant neoplasm of skin, unspecified: Secondary | ICD-10-CM

## 2021-07-16 DIAGNOSIS — C8580 Other specified types of non-Hodgkin lymphoma, unspecified site: Secondary | ICD-10-CM

## 2021-07-16 MED ORDER — MONTELUKAST 10 MG PO TAB
10 mg | Freq: Every evening | ORAL | 0 refills | Status: DC
Start: 2021-07-16 — End: 2021-07-16

## 2021-07-16 MED ORDER — MEPERIDINE (PF) 25 MG/ML IJ SYRG
25 mg | INTRAVENOUS | 0 refills | Status: AC | PRN
Start: 2021-07-16 — End: ?
  Administered 2021-07-16: 15:00:00 25 mg via INTRAVENOUS

## 2021-07-16 MED ORDER — FAMOTIDINE 20 MG PO TAB
20 mg | Freq: Once | ORAL | 0 refills | Status: CP
Start: 2021-07-16 — End: ?
  Administered 2021-07-16: 14:00:00 20 mg via ORAL

## 2021-07-16 MED ORDER — METHYLPREDNISOLONE SOD SUC(PF) 125 MG/2 ML IJ SOLR
125 mg | Freq: Once | INTRAVENOUS | 0 refills | Status: CP
Start: 2021-07-16 — End: ?
  Administered 2021-07-16: 15:00:00 125 mg via INTRAVENOUS

## 2021-07-16 MED ORDER — MONTELUKAST 10 MG PO TAB
10 mg | Freq: Once | ORAL | 0 refills | Status: CP
Start: 2021-07-16 — End: ?
  Administered 2021-07-16: 14:00:00 10 mg via ORAL

## 2021-07-16 MED ORDER — DIPHENHYDRAMINE HCL 50 MG/ML IJ SOLN
25 mg | Freq: Once | INTRAVENOUS | 0 refills | Status: CP
Start: 2021-07-16 — End: ?
  Administered 2021-07-16: 14:00:00 25 mg via INTRAVENOUS

## 2021-07-16 MED ORDER — ACETAMINOPHEN 325 MG PO TAB
650 mg | Freq: Once | ORAL | 0 refills | Status: CP
Start: 2021-07-16 — End: ?
  Administered 2021-07-16: 14:00:00 650 mg via ORAL

## 2021-07-16 MED ORDER — RITUXIMAB-PVVR IVPB
375 mg/m2 | Freq: Once | INTRAVENOUS | 0 refills | Status: CP
Start: 2021-07-16 — End: ?
  Administered 2021-07-16 (×3): 700 mg via INTRAVENOUS

## 2021-07-16 NOTE — Patient Instructions
Call Immediately to report the following:  Uncontrolled nausea and/or vomiting, uncontrolled pain, or unusual bleeding.  Temperature of 100.4 F or greater and/or any sign/symptom of infection (redness, warmth, tenderness)  Painful mouth or difficulty swallowing  Red, cracked, or painful hands and/or feet  Diarrhea   Swelling of arms or legs  Rash    Important Phone Numbers:  [facility] Main Number (answered 24 hours a day) [phone]  Cancer Center Scheduling (appointments) [phone] OR [phone]  [facility]cer [facility] (for nutritional supplements) [phone]

## 2021-07-16 NOTE — Progress Notes
Pt arrived in tx ambulatory, here for scheduled chemotherapy infusion.   PIV inserted to pt's left hand.   Lab results reviewed, pt denies any questions or concerns, voiced understanding of treatment regimen.     Premeds given per plan.   retuximab started at 0924.     Pt experienced  hypersensitivity reaction to Rituxun infusion within an hour of infusion start time at 1022,  retuximab infusion paused, pt c/o chills and started to have rigors. Pt also c/o back pain. Vital signs checked within normal limit.      Demerol 25 mg given and solumedrol 125 mg IV given per protocol. Rigor subsided and pt stated that back pain resolved.     Rituximab infusion restarted at 1046 at the rated of 53m/hr and increased every half hour to the max rate of 3530mhr. Pt tolerated remaining  rituxmab infusion without  side effects. PIV removed in completion, AVS provided, pt discharged ambulatory in stable condition.         CHEMO NOTE  Verified chemo consent signed and in chart.    Blood return positive via: PIV     BSA and dose double checked (agree with orders as written): yes    Chemo regimen: C1D1 Ruxience     Rate verified and armband double check with second RN: See    Patient education offered and stated understanding. Denies questions at this time.

## 2021-07-17 ENCOUNTER — Encounter: Admit: 2021-07-17 | Discharge: 2021-07-17 | Payer: MEDICARE

## 2021-07-17 NOTE — Telephone Encounter
Crystal Cameron was contacted in regard to cycle 1, day 1 chemotherapy.     Patient was asked:    1. Are you experiencing any symptoms or side effects     2. Are you utilizing your supportive medications?     3. Are you able to eat and drink?     4. Were there any prescriptions you were unable to pick up?     5. Do you have any questions I can answer for you?     6. Are you scheduled for your next treatment and/or office visit?     Crystal Cameron verbalized understanding and denied further need at this time.

## 2021-07-22 ENCOUNTER — Encounter: Admit: 2021-07-22 | Discharge: 2021-07-22 | Payer: MEDICARE

## 2021-07-23 ENCOUNTER — Encounter: Admit: 2021-07-23 | Discharge: 2021-07-23 | Payer: MEDICARE

## 2021-07-23 DIAGNOSIS — C8580 Other specified types of non-Hodgkin lymphoma, unspecified site: Secondary | ICD-10-CM

## 2021-07-23 DIAGNOSIS — D759 Disease of blood and blood-forming organs, unspecified: Secondary | ICD-10-CM

## 2021-07-23 DIAGNOSIS — C4490 Unspecified malignant neoplasm of skin, unspecified: Secondary | ICD-10-CM

## 2021-07-23 DIAGNOSIS — H547 Unspecified visual loss: Secondary | ICD-10-CM

## 2021-07-23 DIAGNOSIS — C801 Malignant (primary) neoplasm, unspecified: Secondary | ICD-10-CM

## 2021-07-23 LAB — CBC AND DIFF
ABSOLUTE BASO COUNT: 0 K/UL (ref 0–0.20)
ABSOLUTE EOS COUNT: 0 K/UL (ref 0–0.45)
ABSOLUTE LYMPH COUNT: 0.5 K/UL — ABNORMAL LOW (ref 1.0–4.8)
ABSOLUTE MONO COUNT: 0.1 K/UL (ref 0–0.80)
ABSOLUTE NEUTROPHIL: 1.7 K/UL — ABNORMAL LOW (ref 1.8–7.0)
BASOPHILS %: 1 % (ref 0–2)
EOSINOPHILS %: 1 % (ref >60)
LYMPHOCYTES %: 21 % — ABNORMAL LOW (ref 24–44)
MONOCYTES %: 6 % (ref 4–12)
MPV: 7.3 FL (ref 7–11)
NEUTROPHILS %: 71 % (ref 41–77)
RBC COUNT: 4.6 M/UL (ref 4.0–5.0)
RDW: 17 % — ABNORMAL HIGH (ref 11–15)
WBC COUNT: 2.3 K/UL — ABNORMAL LOW (ref 4.5–11.0)

## 2021-07-23 LAB — COMPREHENSIVE METABOLIC PANEL
POTASSIUM: 4.4 MMOL/L (ref 3.5–5.1)
SODIUM: 143 MMOL/L (ref 137–147)

## 2021-07-23 MED ORDER — METHYLPREDNISOLONE SOD SUC(PF) 125 MG/2 ML IJ SOLR
125 mg | Freq: Once | INTRAVENOUS | 0 refills | Status: CP
Start: 2021-07-23 — End: ?
  Administered 2021-07-23: 17:00:00 125 mg via INTRAVENOUS

## 2021-07-23 MED ORDER — DIPHENHYDRAMINE HCL 25 MG PO CAP
25 mg | Freq: Once | ORAL | 0 refills | Status: AC
Start: 2021-07-23 — End: ?

## 2021-07-23 MED ORDER — ACETAMINOPHEN 325 MG PO TAB
650 mg | Freq: Once | ORAL | 0 refills | Status: CP
Start: 2021-07-23 — End: ?
  Administered 2021-07-23: 17:00:00 650 mg via ORAL

## 2021-07-23 MED ORDER — RITUXIMAB-PVVR IVPB
375 mg/m2 | Freq: Once | INTRAVENOUS | 0 refills | Status: CP
Start: 2021-07-23 — End: ?
  Administered 2021-07-23 (×3): 700 mg via INTRAVENOUS

## 2021-07-23 MED ORDER — DIPHENHYDRAMINE HCL 50 MG/ML IJ SOLN
50 mg | Freq: Once | INTRAVENOUS | 0 refills | Status: CP
Start: 2021-07-23 — End: ?
  Administered 2021-07-23: 17:00:00 25 mg via INTRAVENOUS

## 2021-07-23 NOTE — Progress Notes
Name: Crystal Cameron          MRN: [id number]      DOB: [birth year]      AGE: 65 y.o.   DATE OF SERVICE: 07/23/2021    Subjective:             Reason for Visit:  No chief complaint on file.      Crystal Cameron is a 65 y.o. female.     Cancer Staging  No matching staging information was found for the patient.    History of Present Illness  Crystal Cameron is here today for toxicity check after beginning weekly Rituxan last week for her recently diagnosed marginal zone lymphoma.      She felt a mass in her abdomen.  CAT scan on July 1 showed massive enlargement of the spleen to 20 x 19 cm. ?The liver looked normal. ?There was no adenopathy. ?Lab work showed a hemoglobin 11.2, platelets of 99, ANC of 2300, lymphocytopenia and a white count of 2.8. ?The patient has lost 10 pounds in the last 4 months. ?She denies night sweats, rash, itching.    ?  PET on 06/25/21 showed marked splenomegaly with diffuse hypermetabolism compatible with lymphoma.  No LAD.  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    ?  She reports abdominal fullness, early satiety and fatigue.      --  07/16/21  Start weekly Rituxan    Interim  Hx:  Pt feeling so much better!  Able to eat w/o pain or fullness. Energy is good.  Spleen feels much softer and smaller.         Review of Systems   Constitutional: Positive for fatigue (much more energy). Appetite change: able to eat w/o getting full quickly.   Gastrointestinal: Negative.  Negative for abdominal distention and abdominal pain.        Spleen feels softer and significantly smaller     Psychiatric/Behavioral: Negative for sleep disturbance (sleeping more comfortably).   All other systems reviewed and are negative.        Objective:         ? allopurinoL (ZYLOPRIM) 300 mg tablet Take one tablet by mouth daily. Take with food.   ? IBUPROFEN PO Take  by mouth as Needed.   ? KRILL OIL PO Take 350 mg by mouth daily.   ? lutein 20 mg tab Take 20 mg by mouth daily.   ? Multivitamins with Fluoride (MULTI-VITAMIN PO) Take  by mouth daily.   ? sertraline (ZOLOFT) 50 mg tablet Take 50 mg by mouth daily.     There were no vitals filed for this visit.  There is no height or weight on file to calculate BMI.                  Pain Addressed:  N/A    Patient Evaluated for a Clinical Trial: No treatment clinical trial available for this patient.     [place] Cooperative Oncology Group performance status is 0, Fully active, able to carry on all pre-disease performance without restriction.Marland Kitchen     Physical Exam  Vitals reviewed.   Constitutional:       Appearance: Normal appearance.   HENT:      Head: Normocephalic.   Eyes:      Extraocular Movements: Extraocular movements intact.   Cardiovascular:      Rate and Rhythm: Normal rate.   Pulmonary:      Effort: Pulmonary effort  is normal.   Abdominal:      Palpations: Abdomen is soft.      Comments: Spleen is softer and significantly smaller     Musculoskeletal:         General: Normal range of motion.      Cervical back: Normal range of motion.   Skin:     General: Skin is warm and dry.   Neurological:      General: No focal deficit present.      Mental Status: She is alert and oriented to person, place, and time.   Psychiatric:         Mood and Affect: Mood normal.         Behavior: Behavior normal.         Thought Content: Thought content normal.          Results for orders placed or performed during the hospital encounter of 07/23/21 (from the past 336 hour(s))   CBC AND DIFF   Result Value Ref Range    White Blood Cells 2.3 (L) 4.5 - 11.0 K/UL    RBC 4.61 4.0 - 5.0 M/UL    Hemoglobin 11.6 (L) 12.0 - 15.0 GM/DL    Hematocrit 16.1 36 - 45 %    MCV 78.8 (L) 80 - 100 FL    MCH 25.2 (L) 26 - 34 PG    MCHC 32.0 32.0 - 36.0 G/DL    RDW 09.6 (H) 11 - 15 %    Platelet Count 183 150 - 400 K/UL    MPV 7.3 7 - 11 FL    Neutrophils 71 41 - 77 %    Lymphocytes 21 (L) 24 - 44 %    Monocytes 6 4 - 12 %    Eosinophils 1 0 - 5 %    Basophils 1 0 - 2 %    Absolute Neutrophil Count 1.70 (L) 1.8 - 7.0 K/UL    Absolute Lymph Count 0.50 (L) 1.0 - 4.8 K/UL    Absolute Monocyte Count 0.10 0 - 0.80 K/UL    Absolute Eosinophil Count 0.00 0 - 0.45 K/UL    Absolute Basophil Count 0.00 0 - 0.20 K/UL    ANISO PRESENT     Platelet Estimate NORMAL           Assessment and Plan:       Patient Active Problem List    Diagnosis Date Noted   ? Pancytopenia (HCC) 06/12/2021     06/12/2021 likely related to bone marrow involvement by marginal zone lymphoma.     ? Marginal zone lymphoma [facility]) 05/26/2021     05/14/2021 [facility] laboratory creatinine 1.1, transaminases alk phos total bili normal, TSH 1.1, W2.8 low, H11.2 Below 12, MCV 82.1, platelets 99 low, ANC 2300, lymphocytes 300 slightly low,  05/15/2021 [facility] in [place] CT abdomen pelvis liver is normal sign.  Spleen is massively enlarged measuring 20 cm x 19 cm.  No focal splenic lesions.  No adenopathy.  Diverticulosis throughout the sigmoid colon.  05/20/2021 [facility] medical clinic, [place].  66 year old female to review CAT scan that showed massive splenomegaly.  Lab work showed pancytopenia.  Patient says she has lost weight maybe 55 pounds since last fall.  Also reports weight loss and night sweats.  05/20/2021 [facility] BCR/ABL 1 and 2 not detected  05/22/2021 osl showed W2.3, H10.5, MCV 82, platelets 129, MPV 11.2 slightly elevated, 78% neutrophils  05/29/2021 [facility] peripheral smear pancytopenia.  06/03/2021 this is patient's first visit to [facility] office.  Reviewed imaging showing enlarged spleen along with exam feeling palpable spleen.  Talked about peripheral smear not being helpful.  We will arrange for bone marrow biopsy with conscious sedation and follow-up about 3 weeks later.  She is aware that I am concerned about possible lymphoproliferative sorter or myeloproliferative disorder.  Her mother died from a T-cell lymphoma at an older age.  Her mother also had melanoma.  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  See comment.  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 - - - - consider PET and treatment with BR  06/12/2021 called and talked with patient about results of bone marrow showing marginal zone lymphoma.  Mention plans for PET scan.  Consider Bendamustine rituximab given every 4 weeks x 6 cycles.  Talked about risk for infection due to marginal zone lymphoma and pancytopenia but also risk of infection with both rituximab lymphocyte depletion as well as potential neutropenia with Bendamustine.  We will also do more think about whether single agent rituximab versus BR versus B TK inhibitor would be appropriate.  We will call patient after PET scan and arrange for teaching of appropriate therapy.  Patient will ask questions.  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  06/25/2021 [place] PET scan spleen a megaly with diffuse FDG uptake maximal SUV of 6.6 measuring 21 cm previously 20 cm.  No hypermetabolic lymphadenopathy.  06/25/2021 tried reaching patient.  No answer.  We will check to see if she has had HCV testing.  We will also check HBV testing.  If HCV positive might consider antiretroviral therapy.  If negative would consider rituximab single agent.  06/30/2021 [facility] hepatitis C virus antibody 0.2, negative.  Hepatitis B surface antigen screen negative, hepatitis B core antibody screen negative  07/06/2021 orders entered for Bendamustine rituximab with plans for 6 cycles depending upon tolerance and counts.  07/08/2021 discussed case with Dr. Mikey Bussing.  Will change treatment plan to rituximab 375 mg/metered squared for 4-8 weekly doses depending upon tolerance and benefit and if tolerated probably 4 additional doses at 53-month intervals or some other form of maintenance for 2 years.  This was discussed with the patient.  Orders will be entered.  We will also place orders for vaccines for Streptococcus, Neisseria meningitidis and haemophilus influenza.  07/15/21  Rituxan education.  Start tomorrow.  Pre-splenectomy vaccinations today.  Start allopurinol today.  1 week tox check.  FU with Dr. Madison Hickman in about 4 weeks.    07/23/21  Week 2 Rituxan.  Marked improvement in palpable spleen size.  Lab improved.  Sx significantly improved.  Continue weekly Rituxan and lab.   Continue current allopurinol until gone.  FU with Dr. Madison Hickman in 3 weeks as scheduled      PMH: Generalized anxiety disorder    SH: Patient and husband both from the same small town I believe at [facility].  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

## 2021-07-23 NOTE — Progress Notes
CHEMO NOTE  Verified chemo consent signed and in chart.    Blood return positive via: Peripheral (22 ga)    BSA and dose double checked (agree with orders as written) with: yes with Carleen, RN    Labs/applicable tests checked: CBC and Comprehensive Metabolic Panel (CMP)    Chemo regimen: C1D8 Rituxan    Rate verified and armband double check with second RN: yes    Patient education offered and stated understanding. Denies questions at this time.      Patient presented to clinic for treatment. 22G Peripheral IV placed with positive blood return. Patient's labs okay to treat. Patient tolerated infusion well. Peripheral IV flushed with saline and removed. Patient discharged off unit via ambulation in good condition.

## 2021-07-24 ENCOUNTER — Encounter: Admit: 2021-07-24 | Discharge: 2021-07-24 | Payer: MEDICARE

## 2021-07-30 ENCOUNTER — Encounter: Admit: 2021-07-30 | Discharge: 2021-07-30 | Payer: MEDICARE

## 2021-07-30 DIAGNOSIS — C8580 Other specified types of non-Hodgkin lymphoma, unspecified site: Secondary | ICD-10-CM

## 2021-07-30 DIAGNOSIS — C801 Malignant (primary) neoplasm, unspecified: Secondary | ICD-10-CM

## 2021-07-30 DIAGNOSIS — C4490 Unspecified malignant neoplasm of skin, unspecified: Secondary | ICD-10-CM

## 2021-07-30 DIAGNOSIS — H547 Unspecified visual loss: Secondary | ICD-10-CM

## 2021-07-30 LAB — COMPREHENSIVE METABOLIC PANEL
CHLORIDE: 102 MMOL/L — ABNORMAL LOW (ref 98–110)
POTASSIUM: 4.6 MMOL/L (ref 3.5–5.1)
SODIUM: 140 MMOL/L (ref 137–147)

## 2021-07-30 LAB — CBC AND DIFF
ABSOLUTE NEUTROPHIL: 3.2 K/UL (ref >40)
HEMOGLOBIN: 11 g/dL — ABNORMAL LOW (ref 12.0–15.0)
MCV: 77 FL — ABNORMAL LOW (ref <150)
RBC COUNT: 4.6 M/UL (ref 4.0–5.0)
RDW: 18 % — ABNORMAL HIGH (ref 11–15)

## 2021-07-30 MED ORDER — ACETAMINOPHEN 325 MG PO TAB
650 mg | Freq: Once | ORAL | 0 refills | Status: CP
Start: 2021-07-30 — End: ?
  Administered 2021-07-30: 15:00:00 650 mg via ORAL

## 2021-07-30 MED ORDER — RITUXIMAB-PVVR IVPB (SPEC VOL)
375 mg/m2 | Freq: Once | INTRAVENOUS | 0 refills | Status: CP
Start: 2021-07-30 — End: ?
  Administered 2021-07-30 (×3): 700 mg via INTRAVENOUS

## 2021-07-30 MED ORDER — DIPHENHYDRAMINE HCL 25 MG PO CAP
25 mg | Freq: Once | ORAL | 0 refills | Status: CP
Start: 2021-07-30 — End: ?
  Administered 2021-07-30: 15:00:00 25 mg via ORAL

## 2021-07-30 NOTE — Progress Notes
Cycle 1 Day 15 Ruxience    Patient has no complaints or changes to report.    Ruxience given per Rapid protocol and tolerated without incident.     Discharged in good condition, ambulatory.    CHEMO NOTE    Labs/applicable tests checked: CBC  Verified chemo consent signed and in chart.  BSA and dose double checked (agree with orders as written).    Arm band verified at bedside with second RN.  Premedications/Prehydration given as ordered.  Chemo drug/dose/route: see MAR  Rate verified with second RN.    Patient education offered and stated understanding.

## 2021-08-04 ENCOUNTER — Encounter: Admit: 2021-08-04 | Discharge: 2021-08-04 | Payer: MEDICARE

## 2021-08-05 ENCOUNTER — Encounter: Admit: 2021-08-05 | Discharge: 2021-08-05 | Payer: MEDICARE

## 2021-08-05 MED ORDER — MENING A CONJ VACC,2 OF 2 (PF) 10 MCG /0.5 ML (FINAL) IM SOLR
.5 mL | Freq: Once | INTRAMUSCULAR | 0 refills
Start: 2021-08-05 — End: ?

## 2021-08-05 MED ORDER — MENIN C,Y,W-135 VAC,1 OF 2(PF) 5 MCG X 3/ 0.5 ML (FINAL) IM SOLR
1 | Freq: Once | INTRAMUSCULAR | 0 refills
Start: 2021-08-05 — End: ?

## 2021-08-05 MED ORDER — MENINGOCOCCAL B VACCINE,4-COMP 50-50-50-25 MCG/0.5 ML IM SYRG
.5 mL | Freq: Once | INTRAMUSCULAR | 0 refills
Start: 2021-08-05 — End: ?

## 2021-08-05 NOTE — Telephone Encounter
RN contacted patient to request she bring a bill with her for lab testing.  She will bring with her tomorrow.     Patient had asked about influenza vaccination and COVID booster. Dr. Jacqualyn Posey is ok with her getting them. Advised not to get at same time as would be difficult to discern cause if she had a reaction and not necessarily on treatment day. However, patient will be due for pre-splenectomy vaccinations as well so will need to coordinate timing. Pt sees Dr. Jacqualyn Posey next week.

## 2021-08-06 ENCOUNTER — Encounter: Admit: 2021-08-06 | Discharge: 2021-08-06 | Payer: MEDICARE

## 2021-08-06 DIAGNOSIS — C801 Malignant (primary) neoplasm, unspecified: Secondary | ICD-10-CM

## 2021-08-06 DIAGNOSIS — H547 Unspecified visual loss: Secondary | ICD-10-CM

## 2021-08-06 DIAGNOSIS — C8580 Other specified types of non-Hodgkin lymphoma, unspecified site: Secondary | ICD-10-CM

## 2021-08-06 DIAGNOSIS — C4490 Unspecified malignant neoplasm of skin, unspecified: Secondary | ICD-10-CM

## 2021-08-06 LAB — COMPREHENSIVE METABOLIC PANEL
ALBUMIN: 4.3 g/dL — ABNORMAL LOW (ref 3.5–5.0)
ALK PHOSPHATASE: 69 U/L (ref 25–110)
BLD UREA NITROGEN: 20 mg/dL (ref 7–25)
CALCIUM: 9.2 mg/dL (ref 8.5–10.6)
CHLORIDE: 101 MMOL/L — ABNORMAL LOW (ref 98–110)
CO2: 29 MMOL/L (ref 21–30)
CREATININE: 0.8 mg/dL — ABNORMAL HIGH (ref >60)
GLUCOSE,PANEL: 79 mg/dL — ABNORMAL LOW (ref 70–100)
POTASSIUM: 4 MMOL/L (ref 3.5–5.1)
SODIUM: 138 MMOL/L (ref 137–147)
TOTAL BILIRUBIN: 0.6 mg/dL (ref 0.3–1.2)
TOTAL PROTEIN: 6.9 g/dL (ref 6.0–8.0)

## 2021-08-06 LAB — CBC AND DIFF
RBC COUNT: 4.8 M/UL (ref 4.0–5.0)
WBC COUNT: 4 K/UL — ABNORMAL LOW (ref 4.5–11.0)

## 2021-08-06 MED ORDER — ACETAMINOPHEN 325 MG PO TAB
650 mg | Freq: Once | ORAL | 0 refills | Status: CP
Start: 2021-08-06 — End: ?
  Administered 2021-08-06: 15:00:00 650 mg via ORAL

## 2021-08-06 MED ORDER — DIPHENHYDRAMINE HCL 25 MG PO CAP
25 mg | Freq: Once | ORAL | 0 refills | Status: CP
Start: 2021-08-06 — End: ?
  Administered 2021-08-06: 15:00:00 25 mg via ORAL

## 2021-08-06 MED ORDER — RITUXIMAB-PVVR IVPB (SPEC VOL)
375 mg/m2 | Freq: Once | INTRAVENOUS | 0 refills | Status: CP
Start: 2021-08-06 — End: ?
  Administered 2021-08-06 (×3): 700 mg via INTRAVENOUS

## 2021-08-06 NOTE — Progress Notes
CHEMO NOTE  Verified chemo consent signed and in chart.    Blood return positive via: Peripheral (24 ga)    BSA and dose double checked (agree with orders as written) with: yes, see EMAR.     Labs/applicable tests checked: CBC and Comprehensive Metabolic Panel (CMP)    Chemo regimen: Day 22, Cycle 1; Rituxan     Rate verified and armband double check with second RN: yes    Patient education offered and stated understanding. Denies questions at this time.    Pt arrived for treatment. PIV was placed in R arm. Pt given pre-medications and then given Rituxan. Rituxan was titrated per treatment plan. Pt tolerated infusion without issues. Pt denied any questions or concerns. PIV was removed and pt discharged ambulatory.

## 2021-08-10 ENCOUNTER — Encounter: Admit: 2021-08-10 | Discharge: 2021-08-10 | Payer: MEDICARE

## 2021-08-10 DIAGNOSIS — C8580 Other specified types of non-Hodgkin lymphoma, unspecified site: Secondary | ICD-10-CM

## 2021-08-10 DIAGNOSIS — K1379 Other lesions of oral mucosa: Secondary | ICD-10-CM

## 2021-08-10 MED ORDER — ACYCLOVIR 400 MG PO TAB
400 mg | ORAL_TABLET | ORAL | 0 refills | Status: AC
Start: 2021-08-10 — End: ?

## 2021-08-10 MED ORDER — RXAMB DIPH/LIDO/ANTACID 1:1:1 SUSPENSION (COMPOUND)
10 mL | ORAL | 0 refills | 28.00000 days | Status: AC | PRN
Start: 2021-08-10 — End: ?

## 2021-08-10 MED ORDER — RXAMB DIPH/LIDO/ANTACID 1:1:1 SUSPENSION (COMPOUND)
10 mL | ORAL | 0 refills | Status: CN | PRN
Start: 2021-08-10 — End: ?

## 2021-08-10 NOTE — Telephone Encounter
Patient called to report mouth sores over the weekend. She reports using warm salt water rinses with no relief. Call placed to patient to review. She denies fever or trouble swallowing, denies throat pain. She reports her tongue is swollen red with blister like spots. She does admit to tongue pain. She can not come in today due to distance. She is unsure if she can  Figure out uploading a photo. Spoke with treatment team. Will send RX for Magic Mouthwash to try first, and also Acyclovir if mouthwash does not work. Pharmacy confirms they can compound.   Call placed to review orders. Instructions given to start mouthwash for 1-2 days, if symptoms do not resolve, to start acyclovir. Also encouraged her to try adding baking soda to arm salt water rinses. Patient verbalizes understanding and will contact if further assistance is needed prior to Thursday.

## 2021-08-11 ENCOUNTER — Encounter: Admit: 2021-08-11 | Discharge: 2021-08-11 | Payer: MEDICARE

## 2021-08-11 MED ORDER — DIPHENHYDRAMINE HCL 25 MG PO CAP
25 mg | Freq: Once | ORAL | 0 refills
Start: 2021-08-11 — End: ?

## 2021-08-11 MED ORDER — ACETAMINOPHEN 325 MG PO TAB
650 mg | Freq: Once | ORAL | 0 refills
Start: 2021-08-11 — End: ?

## 2021-08-12 ENCOUNTER — Encounter: Admit: 2021-08-12 | Discharge: 2021-08-12 | Payer: MEDICARE

## 2021-08-13 ENCOUNTER — Encounter: Admit: 2021-08-13 | Discharge: 2021-08-13 | Payer: MEDICARE

## 2021-08-13 LAB — CBC AND DIFF
ABSOLUTE BASO COUNT: 0 K/UL (ref 0–0.20)
ABSOLUTE EOS COUNT: 0.1 K/UL (ref 0–0.45)
ABSOLUTE LYMPH COUNT: 0.7 K/UL — ABNORMAL LOW (ref 1.0–4.8)
ABSOLUTE MONO COUNT: 0.2 K/UL (ref 0–0.80)
ABSOLUTE NEUTROPHIL: 2.2 K/UL (ref 1.8–7.0)
BASOPHILS %: 1 % (ref 0–2)
EOSINOPHILS %: 2 % (ref >60)
HEMATOCRIT: 37 % (ref 36–45)
HEMOGLOBIN: 12 g/dL (ref 12.0–15.0)
LYMPHOCYTES %: 21 % — ABNORMAL LOW (ref 24–44)
MCH: 26 pg (ref 26–34)
MCHC: 32 g/dL (ref 32.0–36.0)
MCV: 80 FL (ref 80–100)
MONOCYTES %: 7 % (ref 4–12)
MPV: 7.6 FL (ref 7–11)
NEUTROPHILS %: 69 % (ref 41–77)
PLATELET COUNT: 203 K/UL (ref 150–400)
RBC COUNT: 4.6 M/UL (ref 4.0–5.0)
RDW: 20 % — ABNORMAL HIGH (ref 11–15)
WBC COUNT: 3.2 K/UL — ABNORMAL LOW (ref 4.5–11.0)

## 2021-08-13 LAB — COMPREHENSIVE METABOLIC PANEL
POTASSIUM: 4.1 MMOL/L (ref 3.5–5.1)
SODIUM: 138 MMOL/L (ref 137–147)

## 2021-08-13 MED ORDER — ACETAMINOPHEN 325 MG PO TAB
650 mg | Freq: Once | ORAL | 0 refills | Status: CP
Start: 2021-08-13 — End: ?
  Administered 2021-08-13: 18:00:00 650 mg via ORAL

## 2021-08-13 MED ORDER — DIPHENHYDRAMINE HCL 25 MG PO CAP
25 mg | Freq: Once | ORAL | 0 refills | Status: CP
Start: 2021-08-13 — End: ?
  Administered 2021-08-13: 18:00:00 25 mg via ORAL

## 2021-08-13 MED ORDER — MENING A CONJ VACC,2 OF 2 (PF) 10 MCG /0.5 ML (FINAL) IM SOLR
.5 mL | Freq: Once | INTRAMUSCULAR | 0 refills | Status: DC
Start: 2021-08-13 — End: 2021-08-13

## 2021-08-13 MED ORDER — MENINGOCOCCAL B VACCINE,4-COMP 50-50-50-25 MCG/0.5 ML IM SYRG
.5 mL | Freq: Once | INTRAMUSCULAR | 0 refills | Status: CP
Start: 2021-08-13 — End: ?
  Administered 2021-08-13: 20:00:00 0.5 mL via INTRAMUSCULAR

## 2021-08-13 MED ORDER — PNEUMOCOCCAL 23-VAL PS VACCINE 25 MCG/0.5 ML IJ SOLN
0.5 mL | Freq: Once | INTRAMUSCULAR | 0 refills
Start: 2021-08-13 — End: ?

## 2021-08-13 MED ORDER — MENING A CONJ VACC,2 OF 2 (PF) 10 MCG /0.5 ML (FINAL) IM SOLR
.5 mL | Freq: Once | INTRAMUSCULAR | 0 refills
Start: 2021-08-13 — End: ?

## 2021-08-13 MED ORDER — MENIN C,Y,W-135 VAC,1 OF 2(PF) 5 MCG X 3/ 0.5 ML (FINAL) IM SOLR
1 | Freq: Once | INTRAMUSCULAR | 0 refills
Start: 2021-08-13 — End: ?

## 2021-08-13 MED ORDER — RITUXIMAB-PVVR IVPB (SPEC VOL)
375 mg/m2 | Freq: Once | INTRAVENOUS | 0 refills | Status: CP
Start: 2021-08-13 — End: ?
  Administered 2021-08-13 (×3): 700 mg via INTRAVENOUS

## 2021-08-13 MED ORDER — MENIN C,Y,W-135 VAC,1 OF 2(PF) 5 MCG X 3/ 0.5 ML (FINAL) IM SOLR
1 | Freq: Once | INTRAMUSCULAR | 0 refills | Status: DC
Start: 2021-08-13 — End: 2021-08-13

## 2021-08-13 NOTE — Progress Notes
Name: Crystal Cameron          MRN: [id number]      DOB: [birth year]      AGE: 65 y.o.   DATE OF SERVICE: 08/13/2021    Subjective:             Reason for Visit:  Heme/Onc Care      Crystal Cameron is a 65 y.o. female.       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  Her bone marrow was showed marginal lymphoma and the PET scan was otherwise normal except for the spleen    Crystal Cameron is here with her husband [name]  They had noticed when he put her arm across her abdomen that he thought he felt a mass.  They gone to see the doctor.  I CAT scan in July 1 showed the spleen of 620 x 19 cm.  The liver looked normal.  There is no adenopathy.  Lab work about that same time showed a hemoglobin 11.2, platelets of 99, and ANC of 2300 and lymphocytopenia and a white count of 2.8.  The patient has been trying to lose weight and maybe lost 10 pounds in the last 4 months.  She does have some sweats at night but that does not psych that is necessarily new.  She is not aware of any skin rashes.  After the diagnosis of marginal zone lymphoma from a bone marrow biopsy on June 11, 2021 the patient had a PET scan that was otherwise unrevealing.  After long discussion the patient began rituximab on July 16, 2021.    The patient is here for her fifth dose of rituximab.  She felt better within about 3 days of the initial therapy.  She has completed her allopurinol.    The patient is also receiving her vaccinations in case she has a splenectomy in the future.  We also talked about COVID vaccinations.  She is up-to-date.  We are checking into Evusheld for her.  Her husband has been vaccinated.    The patient's neutrophils are still low and we will need to be cautious.    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    Our plan is to consider rituximab for a total of 8 weekly doses then a 52-month break and perhaps maintenance for up to 2 years.  ==========================================  The patient has a history of skin cancer and also generalized anxiety disorder.    The patient is originally from around [facility].  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    The patient's past medical, surgical, social, and family histories were reviewed with the patient at this visit. I also reviewed the recent laboratory, pathology, and radiological studies.  The patient is at risk of complications from her disease as well as complication of summer therapy.  I think she will benefit from ongoing surveillance of her physical exam, lab work and radiologic studies.  ==========================================    We will continue with week 5 of her rituximab.  She is doing very well.  She will begin the antiviral for her mouth.  We will then see if it really occurs after improving if that is the pattern we would continue longer-term antiviral.  If it does not improve we might consider an oral paste that may have a topical  benefit.  We have asked her to call if they have questions.           Review of Systems   Constitutional: Negative for chills and fever.        Patient has a patch about 1-1/2 cm x 1 cm on her left top of tongue.  She also has a small spot on the side of her tongue.  They do not look like thrush or typical unearthed blisters.  They may be viral.  The patient does have a history of viral cold sores on her lips.  None recently.   HENT: Positive for mouth sores. Negative for nosebleeds.    Eyes: Negative for pain and visual disturbance.   Respiratory: Negative for cough and shortness of breath.    Cardiovascular: Negative for chest pain and leg swelling.   Gastrointestinal: Negative for abdominal pain.   Genitourinary: Negative for hematuria.   Musculoskeletal: Negative for joint swelling and neck pain.   Skin: Negative for rash.   Neurological: Negative for dizziness and weakness.   Hematological: Negative for adenopathy. Psychiatric/Behavioral: Negative for confusion.   All other systems reviewed and are negative.        Objective:           ? acyclovir (ZOVIRAX) 400 mg tablet Take one tablet by mouth every 8 hours for 7 days. Indications: infection prophylaxis, medical (Patient taking differently: Take 400 mg by mouth every 8 hours. Hasn't started yet  Indications: infection prophylaxis, medical)   ? allopurinoL (ZYLOPRIM) 300 mg tablet Take one tablet by mouth daily. Take with food.   ? DIPH/LIDO/ANTACID 1:1:1 ORAL SUSPENSION (COMPOUND) Swish and Swallow 10 mL by mouth as directed every 4 hours as needed for up to 14 days.   ? IBUPROFEN PO Take  by mouth as Needed.   ? KRILL OIL PO Take 350 mg by mouth daily.   ? lutein 20 mg tab Take 20 mg by mouth daily.   ? Multivitamins with Fluoride (MULTI-VITAMIN PO) Take  by mouth daily.   ? sertraline (ZOLOFT) 50 mg tablet Take 50 mg by mouth daily.       Vitals:    08/13/21 1110   BP: 132/54   BP Source: Arm, Left Upper   Pulse: 81   Temp: 36.8 ?C (98.3 ?F)   Resp: 18   SpO2: 100%   O2 Device: None (Room air)   TempSrc: Oral   PainSc: Zero   Weight: 73.8 kg (162 lb 9.6 oz)     Body mass index is 28.99 kg/m?Marland Kitchen     Medical History:   Diagnosis Date   ? Cancer [facility])    ? Cancer of skin    ? Vision decreased      Surgical History:   Procedure Laterality Date   ? CESAREAN SECTION     ? HAND SURGERY      BONE GRAFT   ? HX APPENDECTOMY     ? TONSILLECTOMY        Social History     Tobacco Use   ? Smoking status: Never Smoker   ? Smokeless tobacco: Never Used   Vaping Use   ? Vaping Use: Never used   Substance Use Topics   ? Alcohol use: Never   ? Drug use: Never     Family History   Problem Relation Age of Onset   ? Cancer Mother 68   ? Arthritis-rheumatoid Mother 26   ? Arthritis-osteo Mother 31   ?  Cancer-Prostate Father    ? Cancer Maternal Aunt 19   ? Cancer Maternal Grandmother 89         Pain Score: Zero            Pain Addressed:  N/A    Patient Evaluated for a Clinical Trial: No treatment clinical trial available for this patient.     [place] Cooperative Oncology Group performance status is 0, Fully active, able to carry on all pre-disease performance without restriction.Marland Kitchen     Physical Exam  Vitals reviewed.   Constitutional:       General: She is not in acute distress.     Appearance: Normal appearance. She is well-developed and normal weight. She is not ill-appearing.      Comments: Spleen almost nonpalpable which is a great improvement   HENT:      Head: Normocephalic and atraumatic.   Eyes:      Conjunctiva/sclera: Conjunctivae normal.   Neck:      Vascular: No JVD.      Trachea: No tracheal deviation.   Cardiovascular:      Rate and Rhythm: Normal rate and regular rhythm.      Heart sounds:     No friction rub. No gallop.   Pulmonary:      Effort: Pulmonary effort is normal. No respiratory distress.      Breath sounds: Normal breath sounds. No stridor. No wheezing or rales.   Abdominal:      General: There is no distension.      Palpations: Abdomen is soft. There is no mass.      Tenderness: There is no abdominal tenderness.   Musculoskeletal:         General: Normal range of motion.      Cervical back: Normal range of motion. No rigidity.      Right lower leg: No edema.      Left lower leg: No edema.   Lymphadenopathy:      Head:      Right side of head: No submental or submandibular adenopathy.      Left side of head: No submental or submandibular adenopathy.      Cervical: No cervical adenopathy.      Right cervical: No superficial or posterior cervical adenopathy.     Left cervical: No superficial or posterior cervical adenopathy.      Upper Body:      Right upper body: No supraclavicular or epitrochlear adenopathy.      Left upper body: No supraclavicular or epitrochlear adenopathy.   Skin:     General: Skin is warm and dry.      Findings: No bruising, lesion or rash.   Neurological:      Mental Status: She is alert and oriented to person, place, and time.      Cranial Nerves: No cranial nerve deficit.      Motor: No weakness.      Gait: Gait normal.   Psychiatric:         Mood and Affect: Mood normal.         Behavior: Behavior normal.         Thought Content: Thought content normal.         Judgment: Judgment normal.               Assessment and Plan:  Patient Active Problem List    Diagnosis Date Noted   ? Marginal zone lymphoma (HCC) 05/26/2021     Priority: Medium  05/14/2021 [facility] laboratory creatinine 1.1, transaminases alk phos total bili normal, TSH 1.1, W2.8 low, H11.2 Below 12, MCV 82.1, platelets 99 low, ANC 2300, lymphocytes 300 slightly low,  05/15/2021 [facility] in [place] CT abdomen pelvis liver is normal sign.  Spleen is massively enlarged measuring 20 cm x 19 cm.  No focal splenic lesions.  No adenopathy.  Diverticulosis throughout the sigmoid colon.  05/20/2021 [facility] medical clinic, [place].  65 year old female to review CAT scan that showed massive splenomegaly.  Lab work showed pancytopenia.  Patient says she has lost weight maybe 55 pounds since last fall.  Also reports weight loss and night sweats.  05/20/2021 [facility] BCR/ABL 1 and 2 not detected  05/22/2021 osl showed W2.3, H10.5, MCV 82, platelets 129, MPV 11.2 slightly elevated, 78% neutrophils  05/29/2021  [facility] peripheral smear pancytopenia.  06/03/2021 this is patient's first visit to [place] [facility] office.  Reviewed imaging showing enlarged spleen along with exam feeling palpable spleen.  Talked about peripheral smear not being helpful.  We will arrange for bone marrow biopsy with conscious sedation and follow-up about 3 weeks later.  She is aware that I am concerned about possible lymphoproliferative sorter or myeloproliferative disorder.  Her mother died from a T-cell lymphoma at an older age.  Her mother also had melanoma.  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  See comment.  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 - - - - consider PET and treatment with BR  06/12/2021 called and talked with patient about results of bone marrow showing marginal zone lymphoma.  Mention plans for PET scan.  Consider Bendamustine rituximab given every 4 weeks x 6 cycles.  Talked about risk for infection due to marginal zone lymphoma and pancytopenia but also risk of infection with both rituximab lymphocyte depletion as well as potential neutropenia with Bendamustine.  We will also do more think about whether single agent rituximab versus BR versus B TK inhibitor would be appropriate.  We will call patient after PET scan and arrange for teaching of appropriate therapy.  Patient will ask questions.  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  06/25/2021 [facility] PET scan spleen a megaly with diffuse FDG uptake maximal SUV of 6.6 measuring 21 cm previously 20 cm.  No hypermetabolic lymphadenopathy.  06/25/2021 tried reaching patient.  No answer.  We will check to see if she has had HCV testing.  We will also check HBV testing.  If HCV positive might consider antiretroviral therapy.  If negative would consider rituximab single agent.  06/30/2021 [facility] hepatitis C virus antibody 0.2, negative.  Hepatitis B surface antigen screen negative, hepatitis B core antibody screen negative  07/06/2021 orders entered for Bendamustine rituximab with plans for 6 cycles depending upon tolerance and counts.  07/08/2021 discussed case with Dr. Mikey Bussing.  Will change treatment plan to rituximab 375 mg/metered squared for 4-8 weekly doses depending upon tolerance and benefit and if tolerated probably 4 additional doses at 8-month intervals or some other form of maintenance for 2 years.  This was discussed with the patient.  Orders will be entered.  We will also place orders for vaccines for Streptococcus, Neisseria meningitidis and haemophilus influenza.  07/15/21  Rituxan education.  Start tomorrow.  Pre-splenectomy vaccinations today.  Start allopurinol today.  1 week tox check.  FU with Dr. Madison Hickman in about 4 weeks.    07/23/21  Week 2 Rituxan.  Marked improvement in palpable spleen size.  Lab improved.  Sx significantly improved.  Continue weekly Rituxan and lab.   FU with Dr. Madison Hickman in 3 weeks as scheduled  08/13/2021 here for week 5 of planned date of Rituxan to be then followed by additional doses may be up to 2 years.  Has a mouth sore that may be viral.  We will probably try her antiviral.  If that does not work there is a mouth paste we may try.  She says belly began feeling better about 3 days after receiving therapy.  Energy improving.  Return to see [place] in 2 in 4 weeks.  Will maybe consider scan 2 months after that 1 more ready to start the next maintenance therapy.    PMH: Generalized anxiety disorder    SH: Patient and husband both from the same small town I believe at [facility].  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                                       ? Pancytopenia (HCC) 06/12/2021     Priority: Low     06/12/2021 likely related to bone marrow involvement by marginal zone lymphoma.     ? Zoster without complications 05/26/2021   ? Other specified anxiety disorders 05/26/2021     This note was created with partial dictation using a dragon dictation system, please notify us if you notice any errors of omissions or content.

## 2021-08-13 NOTE — Progress Notes
Day 1 Cycle 2 Rituxan      Patient to follow up with Dr. Jacqualyn Posey  OK to treat, labs OK to treat.  PIV started, brisk blood return.  Tolerated infusion well.   PIV DC'd intact.   Discharged in stable condition.         CHEMO NOTE  Verified chemo consent signed and in chart.    Blood return positive via: Port (Single)    BSA and dose double checked (agree with orders as written) with: yes     Labs/applicable tests checked: CBC and Comprehensive Metabolic Panel (CMP)    Chemo regimen: Drug/cycle/day  C2 D1 Rituxan    Rate verified and armband double check with second RN: yes, see eMar    Patient education offered and stated understanding. Denies questions at this time.

## 2021-08-13 NOTE — Patient Instructions
Evusheld Fact Sheet    http://sweeney.com/     We discussed timing of Flu vaccine, bivalent COVID 19 vaccine and possible Evusheld vaccination today. (See website above)We also discussed starting the antiviral pills to see if they help your mouth sores. There is another medication we might try.

## 2021-08-13 NOTE — Patient Instructions
Call Immediately to report the following:  Uncontrolled nausea and/or vomiting, uncontrolled pain, or unusual bleeding.  Temperature of 100.4 F or greater and/or any sign/symptom of infection (redness, warmth, tenderness)  Painful mouth or difficulty swallowing  Red, cracked, or painful hands and/or feet  Diarrhea   Swelling of arms or legs  Rash    Important Phone Numbers:  [facility] Main Number (answered 24 hours a day) [phone]  Cancer Center Scheduling (appointments) [phone] OR [phone]  [facility] (for nutritional supplements) [phone]        Port Maintenance - If you have a port, it should be flushed every 6-8 weeks when not in use.  Please check with your MD, nurse, or the scheduler.

## 2021-08-18 ENCOUNTER — Encounter: Admit: 2021-08-18 | Discharge: 2021-08-18 | Payer: MEDICARE

## 2021-08-20 ENCOUNTER — Encounter: Admit: 2021-08-20 | Discharge: 2021-08-20 | Payer: MEDICARE

## 2021-08-20 DIAGNOSIS — C8580 Other specified types of non-Hodgkin lymphoma, unspecified site: Secondary | ICD-10-CM

## 2021-08-20 DIAGNOSIS — C801 Malignant (primary) neoplasm, unspecified: Secondary | ICD-10-CM

## 2021-08-20 DIAGNOSIS — H547 Unspecified visual loss: Secondary | ICD-10-CM

## 2021-08-20 DIAGNOSIS — C4490 Unspecified malignant neoplasm of skin, unspecified: Secondary | ICD-10-CM

## 2021-08-20 LAB — COMPREHENSIVE METABOLIC PANEL
ALBUMIN: 4.2 g/dL — ABNORMAL LOW (ref 3.5–5.0)
ALK PHOSPHATASE: 67 U/L (ref 25–110)
ALT: 12 U/L (ref 7–56)
ANION GAP: 7 K/UL — ABNORMAL LOW (ref 3–12)
AST: 15 U/L (ref 7–40)
BLD UREA NITROGEN: 15 mg/dL (ref 7–25)
CALCIUM: 9.3 mg/dL (ref 8.5–10.6)
CHLORIDE: 100 MMOL/L (ref 98–110)
CO2: 30 MMOL/L (ref 21–30)
CREATININE: 0.8 mg/dL — ABNORMAL HIGH (ref 0.4–1.00)
EGFR: >60 mL/min (ref >60)
GLUCOSE,PANEL: 72 mg/dL (ref 70–100)
POTASSIUM: 3.9 MMOL/L (ref 3.5–5.1)
SODIUM: 137 MMOL/L — ABNORMAL LOW (ref 137–147)
TOTAL BILIRUBIN: 0.7 mg/dL (ref 0.3–1.2)
TOTAL PROTEIN: 6.7 g/dL (ref 6.0–8.0)

## 2021-08-20 LAB — CBC AND DIFF
ABSOLUTE BASO COUNT: 0 K/UL (ref 0–0.20)
ABSOLUTE EOS COUNT: 0.1 K/UL (ref 0–0.45)
RBC COUNT: 4.6 M/UL (ref 4.0–5.0)
WBC COUNT: 4.2 K/UL — ABNORMAL LOW (ref 4.5–11.0)

## 2021-08-20 MED ORDER — DIPHENHYDRAMINE HCL 25 MG PO CAP
25 mg | Freq: Once | ORAL | 0 refills | Status: CP
Start: 2021-08-20 — End: ?
  Administered 2021-08-20: 19:00:00 25 mg via ORAL

## 2021-08-20 MED ORDER — RITUXIMAB-PVVR IVPB (SPEC VOL)
375 mg/m2 | Freq: Once | INTRAVENOUS | 0 refills | Status: CP
Start: 2021-08-20 — End: ?
  Administered 2021-08-20 (×3): 700 mg via INTRAVENOUS

## 2021-08-20 MED ORDER — ACETAMINOPHEN 325 MG PO TAB
650 mg | Freq: Once | ORAL | 0 refills | Status: CP
Start: 2021-08-20 — End: ?
  Administered 2021-08-20: 19:00:00 650 mg via ORAL

## 2021-08-20 NOTE — Progress Notes
Pt arrived in tx ambulatory, here for scheduled chemotherapy infusion.   PIV inserted to pt's right hand  Lab results reviewed within parameter for tx pt denies any questions or concerns, voiced understanding of treatment regimen.     CHEMO NOTE  Verified chemo consent signed and in chart.    Blood return positive via: PIV    BSA and dose double checked (agree with orders as written) : Yes    Chemo regimen: Rapid Rituximab cycle 2 day 8    Rate verified and armband double check with second RN: see emar    Patient education offered and stated understanding. Denies questions at this time.    PIV removed in completion, pt discharged in stable condition.

## 2021-08-26 ENCOUNTER — Encounter: Admit: 2021-08-26 | Discharge: 2021-08-26 | Payer: MEDICARE

## 2021-08-27 ENCOUNTER — Encounter: Admit: 2021-08-27 | Discharge: 2021-08-27 | Payer: MEDICARE

## 2021-08-27 DIAGNOSIS — C801 Malignant (primary) neoplasm, unspecified: Secondary | ICD-10-CM

## 2021-08-27 DIAGNOSIS — H547 Unspecified visual loss: Secondary | ICD-10-CM

## 2021-08-27 DIAGNOSIS — C8580 Other specified types of non-Hodgkin lymphoma, unspecified site: Secondary | ICD-10-CM

## 2021-08-27 DIAGNOSIS — C4490 Unspecified malignant neoplasm of skin, unspecified: Secondary | ICD-10-CM

## 2021-08-27 LAB — CBC AND DIFF
ABSOLUTE EOS COUNT: 0 K/UL (ref 0–0.45)
ABSOLUTE LYMPH COUNT: 0.8 K/UL — ABNORMAL LOW (ref 1.0–4.8)
ABSOLUTE MONO COUNT: 0.2 K/UL (ref 0–0.80)
ABSOLUTE NEUTROPHIL: 2.4 K/UL (ref 1.8–7.0)
BASOPHILS %: 1 % (ref 0–2)
EOSINOPHILS %: 1 % (ref >60)
HEMATOCRIT: 38 % (ref 36–45)
HEMOGLOBIN: 12 g/dL (ref 12.0–15.0)
MCHC: 32 g/dL (ref 32.0–36.0)
MCV: 81 FL (ref 80–100)
MONOCYTES %: 7 % (ref 4–12)
NEUTROPHILS %: 69 % — ABNORMAL HIGH (ref 41–77)
PLATELET COUNT: 191 K/UL (ref 150–400)
RBC COUNT: 4.7 M/UL (ref 4.0–5.0)
RDW: 21 % — ABNORMAL HIGH (ref 11–15)
WBC COUNT: 3.5 K/UL — ABNORMAL LOW (ref 4.5–11.0)

## 2021-08-27 LAB — COMPREHENSIVE METABOLIC PANEL
ALT: 12 U/L — ABNORMAL LOW (ref 7–56)
AST: 14 U/L (ref 7–40)
POTASSIUM: 4.5 MMOL/L (ref 3.5–5.1)
SODIUM: 139 MMOL/L (ref 137–147)
TOTAL PROTEIN: 6.8 g/dL (ref 6.0–8.0)

## 2021-08-27 MED ORDER — TIXAGEVIMAB-CILGAVIMAB 150 MG/1.5 ML- 150 MG/1.5 ML IM SOLN
600 mg | Freq: Once | INTRAMUSCULAR | 0 refills
Start: 2021-08-27 — End: ?

## 2021-08-27 MED ORDER — RITUXIMAB-PVVR IVPB (SPEC VOL)
375 mg/m2 | Freq: Once | INTRAVENOUS | 0 refills | Status: CP
Start: 2021-08-27 — End: ?
  Administered 2021-08-27 (×3): 700 mg via INTRAVENOUS

## 2021-08-27 MED ORDER — DIPHENHYDRAMINE HCL 25 MG PO CAP
25 mg | Freq: Once | ORAL | 0 refills | Status: CP
Start: 2021-08-27 — End: ?
  Administered 2021-08-27: 19:00:00 25 mg via ORAL

## 2021-08-27 MED ORDER — ACETAMINOPHEN 325 MG PO TAB
650 mg | Freq: Once | ORAL | 0 refills | Status: CP
Start: 2021-08-27 — End: ?
  Administered 2021-08-27: 19:00:00 650 mg via ORAL

## 2021-08-27 NOTE — Progress Notes
CHEMO NOTE  Verified chemo consent signed and in chart.    Blood return positive via: Peripheral (24 ga)    BSA and dose double checked (agree with orders as written) with: yes     Labs/applicable tests checked: None    Chemo regimen: Drug/cycle/day C2D15    rituximab-pvvr (RUXIENCE) 700 mg in sodium chloride 0.9% (NS) 250 mL IVPB (rapid infusion)       Rate verified and armband double check with second RN: yes    Patient education offered and stated understanding. Denies questions at this time.      Patient presents to [place] treatment for C2D15 Ruxience. Patient seen in clinic prior to appointment time by Cathleen Fears, APRN-NP, see clinic note for assessment details. PIV placed and positive for blood return. Premeds given per treatment plan. Ruxience given without incident. PIV flushed and removed per protocol. Patient denies further questions or concerns. Patient left CC treatment ambulatory in stable condition.

## 2021-08-27 NOTE — Progress Notes
Name: Crystal Cameron          MRN: [id number]      DOB: 25-Jan-1956      AGE: 65 y.o.   DATE OF SERVICE: 08/27/2021    Subjective:             Reason for Visit:  No chief complaint on file.      Crystal Cameron is a 65 y.o. female.     Cancer Staging  No matching staging information was found for the patient.    History of Present Illness  Crystal Cameron is here today in FU of her marginal zone lymphoma.    ?  She felt a mass in her abdomen. ?CAT scan?on?July 1 showed massive enlargement of?the spleen?to?20 x 19 cm. ?The liver looked normal. ?There?was no adenopathy. ?Lab work showed a hemoglobin 11.2, platelets of 99, ANC of 2300,?lymphocytopenia and a white count of 2.8. ?The patient has lost 10 pounds in the last 4 months. ?She?denies night sweats, rash, itching. ?  ?  PET on 06/25/21 showed marked splenomegaly with diffuse hypermetabolism compatible with lymphoma. ?No LAD. ?BM bx findings on 06/09/21 favored marginal zone lymphoma, 5-10% involvement. ?  ?  She reports abdominal fullness, early satiety and fatigue.??  ?  --  07/16/21  Start weekly Rituxan  ?  Interim  Hx:  Pt feels very good.  Energy is great!  Spleen is no longer palpable.    Pt completed antiviral script and hasn't had any mouth sores since she stopped it over 1 week ago.         Review of Systems   Constitutional: Negative.  Negative for diaphoresis, fatigue and fever.   HENT: Negative.  Negative for mouth sores (none for 1 week).    Respiratory: Negative.    Cardiovascular: Negative.    Gastrointestinal: Negative.    Genitourinary: Negative.    Musculoskeletal: Negative.    Skin: Negative.    Hematological: Negative.    Psychiatric/Behavioral: Negative.    All other systems reviewed and are negative.        Objective:         ? allopurinoL (ZYLOPRIM) 300 mg tablet Take one tablet by mouth daily. Take with food.   ? IBUPROFEN PO Take  by mouth as Needed.   ? KRILL OIL PO Take 350 mg by mouth daily.   ? lutein 20 mg tab Take 20 mg by mouth daily.   ? Multivitamins with Fluoride (MULTI-VITAMIN PO) Take  by mouth daily.   ? sertraline (ZOLOFT) 50 mg tablet Take 50 mg by mouth daily.     There were no vitals filed for this visit.  There is no height or weight on file to calculate BMI.                  Pain Addressed:  N/A    Patient Evaluated for a Clinical Trial: No treatment clinical trial available for this patient.     [place] Cooperative Oncology Group performance status is 0, Fully active, able to carry on all pre-disease performance without restriction.Marland Kitchen     Physical Exam  Vitals reviewed.   Constitutional:       Appearance: Normal appearance.   HENT:      Head: Normocephalic.   Eyes:      Extraocular Movements: Extraocular movements intact.   Cardiovascular:      Rate and Rhythm: Normal rate.   Pulmonary:      Effort: Pulmonary effort is normal.  Musculoskeletal:         General: Normal range of motion.      Cervical back: Normal range of motion.   Skin:     General: Skin is warm and dry.   Neurological:      General: No focal deficit present.      Mental Status: She is alert and oriented to person, place, and time.   Psychiatric:         Mood and Affect: Mood normal.         Behavior: Behavior normal.         Thought Content: Thought content normal.          Results for orders placed or performed during the hospital encounter of 08/27/21 (from the past 336 hour(s))   CBC AND DIFF   Result Value Ref Range    White Blood Cells 3.5 (L) 4.5 - 11.0 K/UL    RBC 4.70 4.0 - 5.0 M/UL    Hemoglobin 12.6 12.0 - 15.0 GM/DL    Hematocrit 16.1 36 - 45 %    MCV 81.6 80 - 100 FL    MCH 26.7 26 - 34 PG    MCHC 32.7 32.0 - 36.0 G/DL    RDW 09.6 (H) 11 - 15 %    Platelet Count 191 150 - 400 K/UL    MPV 7.7 7 - 11 FL    Neutrophils 69 41 - 77 %    Lymphocytes 22 (L) 24 - 44 %    Monocytes 7 4 - 12 %    Eosinophils 1 0 - 5 %    Basophils 1 0 - 2 %    Absolute Neutrophil Count 2.40 1.8 - 7.0 K/UL    Absolute Lymph Count 0.80 (L) 1.0 - 4.8 K/UL    Absolute Monocyte Count 0.20 0 - 0.80 K/UL    Absolute Eosinophil Count 0.00 0 - 0.45 K/UL    Absolute Basophil Count 0.00 0 - 0.20 K/UL          Assessment and Plan:            Marginal zone lymphoma.  Week 3 of a planned 4 doses of Rituxan.  The pt feels considerably better than when she started therapy, and labs have also improved.  Spleen is no longer palpable to the patient.  FU with Dr. Georgiann Hahn in 2 weeks (1 week after last dose) w/ lab and to receive splenectomy vaccines.      Infection risk.   The patient will return on 09/04/21 for her Evusheld vaccine.  Discussed with the pharmacy that it's ok to receive it the same day as her last rituxan infusion.    Both written and verbal information were given to the patient today regarding treatment.  Plan of administration and potential side effects were reviewed with her. The Evusheld Fact Sheet below was given to the patient and reviewed.  She verbalized understanding and gave consent for treatment.      [email or web address].[email or web address]

## 2021-08-27 NOTE — Progress Notes
PATIENT NOTIFIED 08/27/21@4 :01 ON CELL REGARDING NEW EVUSHELD---RGL

## 2021-09-03 ENCOUNTER — Encounter: Admit: 2021-09-03 | Discharge: 2021-09-03 | Payer: MEDICARE

## 2021-09-03 DIAGNOSIS — C8580 Other specified types of non-Hodgkin lymphoma, unspecified site: Secondary | ICD-10-CM

## 2021-09-03 LAB — CBC AND DIFF
ABSOLUTE BASO COUNT: 0 K/UL (ref 0–0.20)
ABSOLUTE EOS COUNT: 0.1 K/UL (ref 0–0.45)
ABSOLUTE LYMPH COUNT: 0.7 K/UL — ABNORMAL LOW (ref 1.0–4.8)
ABSOLUTE MONO COUNT: 0.2 K/UL (ref >60)
BASOPHILS %: 1 % (ref 0–2)
HEMOGLOBIN: 12 g/dL (ref 12.0–15.0)
LYMPHOCYTES %: 21 % — ABNORMAL LOW (ref 24–44)
NEUTROPHILS %: 71 % (ref 41–77)
RBC COUNT: 4.7 M/UL (ref 4.0–5.0)
WBC COUNT: 3.4 K/UL — ABNORMAL LOW (ref 4.5–11.0)

## 2021-09-03 LAB — COMPREHENSIVE METABOLIC PANEL
ALK PHOSPHATASE: 65 U/L (ref 25–110)
ALT: 12 U/L (ref 7–56)
AST: 13 U/L (ref 7–40)
BLD UREA NITROGEN: 20 mg/dL (ref 7–25)
CALCIUM: 9.6 mg/dL (ref 8.5–10.6)
CHLORIDE: 102 MMOL/L (ref 98–110)
CREATININE: 0.8 mg/dL — ABNORMAL HIGH (ref 0.4–1.00)
GLUCOSE,PANEL: 68 mg/dL — ABNORMAL LOW (ref 70–100)
POTASSIUM: 3.9 MMOL/L (ref 3.5–5.1)
TOTAL PROTEIN: 7.1 g/dL (ref 6.0–8.0)

## 2021-09-03 MED ORDER — RITUXIMAB-PVVR IVPB (SPEC VOL)
375 mg/m2 | Freq: Once | INTRAVENOUS | 0 refills | Status: CP
Start: 2021-09-03 — End: ?
  Administered 2021-09-03 (×3): 700 mg via INTRAVENOUS

## 2021-09-03 MED ORDER — TIXAGEVIMAB-CILGAVIMAB 150 MG/1.5 ML- 150 MG/1.5 ML IM SOLN
600 mg | Freq: Once | INTRAMUSCULAR | 0 refills | Status: CP
Start: 2021-09-03 — End: ?
  Administered 2021-09-03: 15:00:00 600 mg via INTRAMUSCULAR

## 2021-09-03 MED ORDER — DIPHENHYDRAMINE HCL 25 MG PO CAP
25 mg | Freq: Once | ORAL | 0 refills | Status: CP
Start: 2021-09-03 — End: ?
  Administered 2021-09-03: 15:00:00 25 mg via ORAL

## 2021-09-03 MED ORDER — ACETAMINOPHEN 325 MG PO TAB
650 mg | Freq: Once | ORAL | 0 refills | Status: CP
Start: 2021-09-03 — End: ?
  Administered 2021-09-03: 15:00:00 650 mg via ORAL

## 2021-09-03 NOTE — Progress Notes
Cycle 2 Day 22 Ruxience    Patient has no complaints or changes to report.  Ruxience tolerated per rapid protocol.    Discharged in good condition, ambulatory.    CHEMO NOTE    Labs/applicable tests checked: CBC, CMP  Verified chemo consent signed and in chart.  BSA and dose double checked (agree with orders as written).    Arm band verified at bedside with second RN.  Premedications/Prehydration given as ordered.  Chemo drug/dose/route: see MAR  Rate verified with second RN.    Patient education offered and stated understanding.

## 2021-09-04 ENCOUNTER — Encounter: Admit: 2021-09-04 | Discharge: 2021-09-04 | Payer: MEDICARE

## 2021-09-10 ENCOUNTER — Encounter: Admit: 2021-09-10 | Discharge: 2021-09-10 | Payer: MEDICARE

## 2021-09-11 ENCOUNTER — Encounter: Admit: 2021-09-11 | Discharge: 2021-09-11 | Payer: MEDICARE

## 2021-09-11 DIAGNOSIS — H547 Unspecified visual loss: Secondary | ICD-10-CM

## 2021-09-11 DIAGNOSIS — C4490 Unspecified malignant neoplasm of skin, unspecified: Secondary | ICD-10-CM

## 2021-09-11 DIAGNOSIS — C8580 Other specified types of non-Hodgkin lymphoma, unspecified site: Secondary | ICD-10-CM

## 2021-09-11 DIAGNOSIS — R161 Splenomegaly, not elsewhere classified: Secondary | ICD-10-CM

## 2021-09-11 DIAGNOSIS — C801 Malignant (primary) neoplasm, unspecified: Secondary | ICD-10-CM

## 2021-09-11 LAB — CBC AND DIFF
ABSOLUTE BASO COUNT: 0 K/UL (ref 0–0.20)
ABSOLUTE EOS COUNT: 0.1 K/UL (ref 0–0.45)
ABSOLUTE NEUTROPHIL: 2.4 K/UL (ref 1.8–7.0)
BASOPHILS %: 1 % — ABNORMAL HIGH (ref 0–2)
HEMATOCRIT: 40 % (ref 36–45)
LYMPHOCYTES %: 23 % — ABNORMAL LOW (ref 24–44)
MONOCYTES %: 7 % (ref 4–12)
RBC COUNT: 4.7 M/UL (ref 4.0–5.0)
RDW: 20 % — ABNORMAL HIGH (ref 11–15)
WBC COUNT: 3.5 K/UL — ABNORMAL LOW (ref 4.5–11.0)

## 2021-09-11 LAB — COMPREHENSIVE METABOLIC PANEL
ANION GAP: 8 K/UL — ABNORMAL LOW (ref 3–12)
AST: 15 U/L (ref 7–40)
BLD UREA NITROGEN: 18 mg/dL (ref 7–25)
CALCIUM: 10 mg/dL (ref 8.5–10.6)
CHLORIDE: 101 MMOL/L (ref 98–110)
EGFR: >60 mL/min (ref >60)
GLUCOSE,PANEL: 79 mg/dL (ref 70–100)
SODIUM: 140 MMOL/L (ref 137–147)
TOTAL BILIRUBIN: 0.7 mg/dL (ref 0.3–1.2)
TOTAL PROTEIN: 7 g/dL (ref 6.0–8.0)

## 2021-09-11 LAB — IMMUNOGLOBULIN G (IGG): IGG: 105 mg/dL (ref 762–1488)

## 2021-09-11 MED ORDER — MENIN C,Y,W-135 VAC,1 OF 2(PF) 5 MCG X 3/ 0.5 ML (FINAL) IM SOLR
1 | Freq: Once | INTRAMUSCULAR | 0 refills | Status: CP
Start: 2021-09-11 — End: ?

## 2021-09-11 MED ORDER — ACETAMINOPHEN 325 MG PO TAB
650 mg | Freq: Once | ORAL | 0 refills
Start: 2021-09-11 — End: ?

## 2021-09-11 MED ORDER — DIPHENHYDRAMINE HCL 25 MG PO CAP
25 mg | Freq: Once | ORAL | 0 refills
Start: 2021-09-11 — End: ?

## 2021-09-11 MED ORDER — PNEUMOCOCCAL 23-VAL PS VACCINE 25 MCG/0.5 ML IJ SOLN
0.5 mL | Freq: Once | INTRAMUSCULAR | 0 refills | Status: CP
Start: 2021-09-11 — End: ?
  Administered 2021-09-11: 15:00:00 0.5 mL via INTRAMUSCULAR

## 2021-09-11 MED ORDER — RITUXIMAB-PVVR IVPB (SPEC VOL)
375 mg/m2 | Freq: Once | INTRAVENOUS | 0 refills
Start: 2021-09-11 — End: ?

## 2021-09-11 MED ORDER — MENING A CONJ VACC,2 OF 2 (PF) 10 MCG /0.5 ML (FINAL) IM SOLR
.5 mL | Freq: Once | INTRAMUSCULAR | 0 refills | Status: CP
Start: 2021-09-11 — End: ?
  Administered 2021-09-11: 15:00:00 0.5 mL via INTRAMUSCULAR

## 2021-09-11 NOTE — Progress Notes
Name: Crystal Cameron          MRN: [id number]      DOB: 08-07-1956      AGE: 65 y.o.   DATE OF SERVICE: 09/11/2021    Subjective:             Reason for Visit:  Cancer Follow up      Crystal Cameron is a 65 y.o. female.     Cancer Staging  No matching staging information was found for the patient.    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  Her bone marrow was showed marginal lymphoma and the PET scan was otherwise normal except for the spleen    Crystal Cameron is here with her husband [name]  They had noticed when he put her arm across her abdomen that he thought he felt a mass.  They gone to see the doctor.  I CAT scan in July 1 showed the spleen of 620 x 19 cm.  The liver looked normal.  There is no adenopathy.  Lab work about that same time showed a hemoglobin 11.2, platelets of 99, and ANC of 2300 and lymphocytopenia and a white count of 2.8.  The patient has been trying to lose weight and maybe lost 10 pounds in the last 4 months.  She does have some sweats at night but that does not psych that is necessarily new.  She is not aware of any skin rashes.  After the diagnosis of marginal zone lymphoma from a bone marrow biopsy on June 11, 2021 the patient had a PET scan that was otherwise unrevealing.  After long discussion the patient began rituximab on July 16, 2021.    The patient has now completed 8 doses of rituximab.  Her symptoms are much improved.  She feels better than she has in probably 6 or 8 months.  Her counts are also improving.      We talked about doing maintenance rituximab every 2 months for the next 2 years.  We will schedule this in 2 months.  We will arrange for scans before that time.      The patient is also receiving her vaccinations in case she has a splenectomy in the future.  We also talked about COVID vaccinations.  She is up-to-date.     ==========================================  The patient has a history of skin cancer and also generalized anxiety disorder.    The patient is originally from around [facility].  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    The patient's past medical, surgical, social, and family histories were reviewed with the patient at this visit. I also reviewed the recent laboratory, pathology, and radiological studies.  The patient is at risk of complications from her disease as well as complication of summer therapy.  I think she will benefit from ongoing surveillance of her physical exam, lab work and radiologic studies.  ==========================================    The patient will return in 2 months for her next Rituxan Mab given once every 2 months for the next 2 years.  We will arrange a scan before that time.  She will call if concerns or questions.           Review of Systems   Constitutional: Negative for chills and fever.   HENT: Negative for nosebleeds.    Eyes:  Negative for pain and visual disturbance.   Respiratory: Negative for cough and shortness of breath.    Cardiovascular: Negative for chest pain and leg swelling.   Gastrointestinal: Negative for abdominal pain.   Genitourinary: Negative for hematuria.   Musculoskeletal: Negative for joint swelling and neck pain.   Skin: Negative for rash.   Neurological: Negative for dizziness and weakness.   Hematological: Negative for adenopathy.   Psychiatric/Behavioral: Negative for confusion.         Objective:           ? acetaminophen (TYLENOL EXTRA STRENGTH) 500 mg tablet Take 500 mg by mouth as Needed for Pain. Max of 4,000 mg of acetaminophen in 24 hours.   ? allopurinoL (ZYLOPRIM) 300 mg tablet Take one tablet by mouth daily. Take with food.   ? IBUPROFEN PO Take  by mouth as Needed.   ? KRILL OIL PO Take 350 mg by mouth daily.   ? lutein 20 mg tab Take 20 mg by mouth daily.   ? Multivitamins with Fluoride (MULTI-VITAMIN PO) Take  by mouth daily.   ? sertraline (ZOLOFT) 50 mg tablet Take 50 mg by mouth daily.       Vitals:    09/11/21 0855   BP: 122/62   BP Source: Arm, Left Upper   Pulse: 61   Temp: 36.8 ?C (98.3 ?F)   Resp: 18   SpO2: 100%   TempSrc: Oral   PainSc: Zero   Weight: 73.9 kg (163 lb)     Body mass index is 29.06 kg/m?Marland Kitchen     Medical History:   Diagnosis Date   ? Cancer [facility])    ? Cancer of skin    ? Vision decreased      Surgical History:   Procedure Laterality Date   ? CESAREAN SECTION     ? HAND SURGERY      BONE GRAFT   ? HX APPENDECTOMY     ? TONSILLECTOMY        Social History     Tobacco Use   ? Smoking status: Never Smoker   ? Smokeless tobacco: Never Used   Vaping Use   ? Vaping Use: Never used   Substance Use Topics   ? Alcohol use: Never   ? Drug use: Never     Family History   Problem Relation Age of Onset   ? Cancer Mother 1   ? Arthritis-rheumatoid Mother 31   ? Arthritis-osteo Mother 72   ? Cancer-Prostate Father    ? Cancer Maternal Aunt 19   ? Cancer Maternal Grandmother 89         Pain Score: Zero            Pain Addressed:  N/A    Patient Evaluated for a Clinical Trial: No treatment clinical trial available for this patient.     [place] Cooperative Oncology Group performance status is 0, Fully active, able to carry on all pre-disease performance without restriction.Marland Kitchen     Physical Exam  Vitals reviewed.   Constitutional:       General: She is not in acute distress.     Appearance: Normal appearance. She is well-developed and normal weight. She is not ill-appearing.   HENT:      Head: Normocephalic and atraumatic.   Eyes:      Conjunctiva/sclera: Conjunctivae normal.   Neck:      Vascular: No JVD.      Trachea: No tracheal deviation.   Cardiovascular:  Rate and Rhythm: Normal rate and regular rhythm.      Heart sounds:     No friction rub. No gallop.   Pulmonary:      Effort: Pulmonary effort is normal. No respiratory distress.      Breath sounds: Normal breath sounds. No stridor. No wheezing or rales.   Abdominal:      General: There is no distension.      Palpations: Abdomen is soft. There is no mass.      Tenderness: There is no abdominal tenderness.   Musculoskeletal:         General: Normal range of motion.      Cervical back: Normal range of motion. No rigidity.      Right lower leg: No edema.      Left lower leg: No edema.   Lymphadenopathy:      Head:      Right side of head: No submental or submandibular adenopathy.      Left side of head: No submental or submandibular adenopathy.      Cervical: No cervical adenopathy.      Right cervical: No superficial or posterior cervical adenopathy.     Left cervical: No superficial or posterior cervical adenopathy.      Upper Body:      Right upper body: No supraclavicular or epitrochlear adenopathy.      Left upper body: No supraclavicular or epitrochlear adenopathy.   Skin:     General: Skin is warm and dry.      Findings: No bruising, lesion or rash.   Neurological:      Mental Status: She is alert and oriented to person, place, and time.      Cranial Nerves: No cranial nerve deficit.      Motor: No weakness.      Gait: Gait normal.   Psychiatric:         Mood and Affect: Mood normal.         Behavior: Behavior normal.         Thought Content: Thought content normal.         Judgment: Judgment normal.               Assessment and Plan:  Patient Active Problem List    Diagnosis Date Noted   ? Marginal zone lymphoma (HCC) 05/26/2021     Priority: Medium     05/14/2021 [facility] laboratory creatinine 1.1, transaminases alk phos total bili normal, TSH 1.1, W2.8 low, H11.2 Below 12, MCV 82.1, platelets 99 low, ANC 2300, lymphocytes 300 slightly low,  05/15/2021 [facility] in [place] abdomen pelvis liver is normal sign.  Spleen is massively enlarged measuring 20 cm x 19 cm.  No focal splenic lesions.  No adenopathy.  Diverticulosis throughout the sigmoid colon.  05/20/2021 [facility], [place]. [place].  65 year old female to review CAT scan that showed massive splenomegaly.  Lab work showed pancytopenia.  Patient says she has lost weight maybe 55 pounds since last fall.  Also reports weight loss and night sweats.  05/20/2021 [facility] BCR/ABL 1 and 2 not detected  05/22/2021 osl showed W2.3, H10.5, MCV 82, platelets 129, MPV 11.2 slightly elevated, 78% neutrophils  05/29/2021 [facility] peripheral smear pancytopenia.  06/03/2021 this is patient's first visit to [facility] office.  Reviewed imaging showing enlarged spleen along with exam feeling palpable spleen.  Talked about peripheral smear  not being helpful.  We will arrange for bone marrow biopsy with conscious sedation and follow-up about 3 weeks later.  She is aware that I am concerned about possible lymphoproliferative sorter or myeloproliferative disorder.  Her mother died from a T-cell lymphoma at an older age.  Her mother also had melanoma.  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  See comment.  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 - - - - consider PET and treatment with BR  06/12/2021 called and talked with patient about results of bone marrow showing marginal zone lymphoma.  Mention plans for PET scan.  Consider Bendamustine rituximab given every 4 weeks x 6 cycles.  Talked about risk for infection due to marginal zone lymphoma and pancytopenia but also risk of infection with both rituximab lymphocyte depletion as well as potential neutropenia with Bendamustine.  We will also do more think about whether single agent rituximab versus BR versus B TK inhibitor would be appropriate.  We will call patient after PET scan and arrange for teaching of appropriate therapy.  Patient will ask questions.  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  06/25/2021 [place] PET scan spleen a megaly with diffuse FDG uptake maximal SUV of 6.6 measuring 21 cm previously 20 cm.  No hypermetabolic lymphadenopathy.  06/25/2021 tried reaching patient.  No answer.  We will check to see if she has had HCV testing.  We will also check HBV testing.  If HCV positive might consider antiretroviral therapy.  If negative would consider rituximab single agent.  06/30/2021 [facility] hepatitis C virus antibody 0.2, negative.  Hepatitis B surface antigen screen negative, hepatitis B core antibody screen negative  07/06/2021 orders entered for Bendamustine rituximab with plans for 6 cycles depending upon tolerance and counts.  07/08/2021 discussed case with Dr. Mikey Bussing.  Will change treatment plan to rituximab 375 mg/metered squared for 4-8 weekly doses depending upon tolerance and benefit and if tolerated probably 4 additional doses at 1-month intervals or some other form of maintenance for 2 years.  This was discussed with the patient.  Orders will be entered.  We will also place orders for vaccines for Streptococcus, Neisseria meningitidis and haemophilus influenza.  07/15/21  Rituxan education.  Start tomorrow.  Pre-splenectomy vaccinations today.  Start allopurinol today.  1 week tox check.  FU with Dr. Madison Hickman in about 4 weeks.    07/23/21  Week 2 Rituxan.  Marked improvement in palpable spleen size.  Lab improved.  Sx significantly improved.  Continue weekly Rituxan and lab.   FU with Dr. Madison Hickman in 3 weeks as scheduled  08/13/2021 here for week 5 of planned date of Rituxan to be then followed by additional doses may be up to 2 years.  Has a mouth sore that may be viral.  We will probably try her antiviral.  If that does not work there is a mouth paste we may try.  She says belly began feeling better about 3 days after receiving therapy.  Energy improving.  Return to see [place] in 2 in 4 weeks.  Will maybe consider scan 2 months after that 1 more ready to start the next maintenance therapy.  09/11/2021 patient feeling much better.  Exam much improved.  Spleen at the edge of the ribs on inspiration.  We will plan on every 2 month  Rituxan for the next 2 years.  We will do a scan before return in 2 months.  To call if questions.      PMH: Generalized anxiety disorder    SH: Patient and husband both from the same small town I believe at [facility].  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                                       ? Pancytopenia (HCC) 06/12/2021     Priority: Low     06/12/2021 likely related to bone marrow involvement by marginal zone lymphoma.     ? Zoster without complications 05/26/2021   ? Other specified anxiety disorders 05/26/2021     This note was created with partial dictation using a dragon dictation system, please notify us if you notice any errors of omissions or content.

## 2021-09-11 NOTE — Progress Notes
Patient here for Menveo and Pneumococcal vaccinations.   Injections given per plan.   Tolerated well.   Discharged in stable condition.

## 2021-09-16 ENCOUNTER — Encounter: Admit: 2021-09-16 | Discharge: 2021-09-16 | Payer: MEDICARE

## 2021-09-16 DIAGNOSIS — C8580 Other specified types of non-Hodgkin lymphoma, unspecified site: Secondary | ICD-10-CM

## 2021-10-20 ENCOUNTER — Encounter: Admit: 2021-10-20 | Discharge: 2021-10-20 | Payer: MEDICARE

## 2021-10-23 ENCOUNTER — Encounter: Admit: 2021-10-23 | Discharge: 2021-10-23 | Payer: MEDICARE

## 2021-10-23 DIAGNOSIS — R161 Splenomegaly, not elsewhere classified: Secondary | ICD-10-CM

## 2021-10-23 DIAGNOSIS — C8580 Other specified types of non-Hodgkin lymphoma, unspecified site: Secondary | ICD-10-CM

## 2021-10-23 LAB — KAPPA/LAMBDA FREE LIGHT CHAINS
KAPPA FLC: 2.7 mg/dL — ABNORMAL HIGH (ref 0.33–1.94)
KAPPA/LAMBDA FLC: 1.4 (ref 0.26–1.65)
LAMBDA FLC: 1.8 mg/dL (ref 0.57–2.63)

## 2021-10-23 LAB — POC CREATININE, RAD: CREATININE, POC: 0.8 mg/dL (ref 0.4–1.00)

## 2021-10-23 MED ORDER — SODIUM CHLORIDE 0.9 % IJ SOLN
50 mL | Freq: Once | INTRAVENOUS | 0 refills | Status: CP
Start: 2021-10-23 — End: ?
  Administered 2021-10-23: 18:00:00 50 mL via INTRAVENOUS

## 2021-10-23 MED ORDER — IOHEXOL 350 MG IODINE/ML IV SOLN
100 mL | Freq: Once | INTRAVENOUS | 0 refills | Status: CP
Start: 2021-10-23 — End: ?
  Administered 2021-10-23: 18:00:00 100 mL via INTRAVENOUS

## 2021-10-26 ENCOUNTER — Encounter: Admit: 2021-10-26 | Discharge: 2021-10-26 | Payer: MEDICARE

## 2021-10-26 NOTE — Telephone Encounter
Patient called to let office know her spouse has COVID. She is remaining asymptomatic at this time. Her pcp gave her some home tests but has not tested herself and was advised to not UNLESS symptomatic. She does report her spouse is improving. I let her know we will keep appointment for Friday as scheduled BUT to contact office on Thursday to check in. If spouse get's worse or she develops symptoms, treatment might be postponed. She verbalized understanding.

## 2021-10-29 ENCOUNTER — Encounter: Admit: 2021-10-29 | Discharge: 2021-10-29 | Payer: MEDICARE

## 2021-10-29 NOTE — Telephone Encounter
CALLED PATIENT AND GAVE HER NEW APPT TIME 10/29/21 9:56AM

## 2021-10-29 NOTE — Telephone Encounter
Patient called to report she tested positive for COVID on Tuesday. She has started paxlovid. She would like to reschedule treatment for after Christmas. She will let us know if she does not improve. Tab placed to cancel Friday and reschedule.

## 2021-11-10 ENCOUNTER — Encounter: Admit: 2021-11-10 | Discharge: 2021-11-10 | Payer: MEDICARE

## 2021-11-10 NOTE — Telephone Encounter
Message on RN line form Vernie that she had finished Paxlovid and believes she's had COVID rebound.     1st COVID symptoms started 10/26/21. Paxlovid 12/13 for 5 days. 11/01/21 tested negative. States she started having rebound symptoms on 11/05/21. She's lost her sense of smell and taste this time. Denies colored drainage or sputum, states she has been taking Mucinex throughout and mucous is drying up. Denies swelling of limbs, states energy level is improving, does not have pulse oximeter at home, but feels like she is breathing ok currently, denies discolored fingernail beds. States she is drinking fluids better now, has cough, denies fever, still having headaches.     She is scheduled for lab/office visit and treatment on Friday and would like to know what to do. She is now 6 days post-COVID rebound symptom onset. Will discuss with Dr. Jacqualyn Posey and notify patient of plan.

## 2021-11-19 ENCOUNTER — Encounter: Admit: 2021-11-19 | Discharge: 2021-11-19 | Payer: MEDICARE

## 2021-11-20 ENCOUNTER — Encounter: Admit: 2021-11-20 | Discharge: 2021-11-20 | Payer: MEDICARE

## 2021-11-20 DIAGNOSIS — C8580 Other specified types of non-Hodgkin lymphoma, unspecified site: Secondary | ICD-10-CM

## 2021-11-20 DIAGNOSIS — H547 Unspecified visual loss: Secondary | ICD-10-CM

## 2021-11-20 DIAGNOSIS — C801 Malignant (primary) neoplasm, unspecified: Secondary | ICD-10-CM

## 2021-11-20 DIAGNOSIS — C4490 Unspecified malignant neoplasm of skin, unspecified: Secondary | ICD-10-CM

## 2021-11-20 LAB — CBC AND DIFF
ABSOLUTE BASO COUNT: 0 K/UL (ref 0–0.20)
ABSOLUTE EOS COUNT: 0.1 K/UL (ref 0–0.45)
RBC COUNT: 4.5 M/UL (ref 4.0–5.0)
WBC COUNT: 4 K/UL — ABNORMAL LOW (ref 4.5–11.0)

## 2021-11-20 LAB — COMPREHENSIVE METABOLIC PANEL
ALBUMIN: 4.1 g/dL — ABNORMAL LOW (ref 3.5–5.0)
ALK PHOSPHATASE: 59 U/L (ref 25–110)
ALT: 12 U/L (ref 7–56)
ANION GAP: 9 K/UL — ABNORMAL LOW (ref 3–12)
AST: 15 U/L (ref 7–40)
CALCIUM: 9.4 mg/dL (ref 8.5–10.6)
CO2: 28 MMOL/L (ref 21–30)
CREATININE: 0.7 mg/dL (ref 0.4–1.00)
EGFR: >60 mL/min (ref >60)
GLUCOSE,PANEL: 87 mg/dL (ref 70–100)
POTASSIUM: 4.1 MMOL/L (ref 3.5–5.1)
SODIUM: 142 MMOL/L (ref 137–147)
TOTAL BILIRUBIN: 0.5 mg/dL (ref 0.3–1.2)
TOTAL PROTEIN: 6.7 g/dL (ref 6.0–8.0)

## 2021-11-20 LAB — IMMUNOGLOBULIN G (IGG): IGG: 975 mg/dL (ref 762–1488)

## 2021-11-20 LAB — LDH-LACTATE DEHYDROGENASE: LDH: 174 U/L (ref 100–210)

## 2021-11-20 MED ORDER — DIPHENHYDRAMINE HCL 25 MG PO CAP
25 mg | Freq: Once | ORAL | 0 refills | Status: CP
Start: 2021-11-20 — End: ?
  Administered 2021-11-20: 16:00:00 25 mg via ORAL

## 2021-11-20 MED ORDER — DIPHENHYDRAMINE HCL 25 MG PO CAP
25 mg | Freq: Once | ORAL | 0 refills
Start: 2021-11-20 — End: ?

## 2021-11-20 MED ORDER — ACETAMINOPHEN 325 MG PO TAB
650 mg | Freq: Once | ORAL | 0 refills
Start: 2021-11-20 — End: ?

## 2021-11-20 MED ORDER — RITUXIMAB-PVVR IVPB (SPEC VOL)
375 mg/m2 | Freq: Once | INTRAVENOUS | 0 refills
Start: 2021-11-20 — End: ?

## 2021-11-20 MED ORDER — RITUXIMAB-PVVR IVPB (SPEC VOL)
375 mg/m2 | Freq: Once | INTRAVENOUS | 0 refills | Status: CP
Start: 2021-11-20 — End: ?
  Administered 2021-11-20 (×3): 700 mg via INTRAVENOUS

## 2021-11-20 MED ORDER — ACETAMINOPHEN 325 MG PO TAB
650 mg | Freq: Once | ORAL | 0 refills | Status: CP
Start: 2021-11-20 — End: ?
  Administered 2021-11-20: 16:00:00 650 mg via ORAL

## 2021-11-20 NOTE — Progress Notes
CHEMO NOTE  Verified chemo consent signed and in chart.    Blood return positive via: Peripheral (24 ga)    BSA and dose double checked (agree with orders as written) with: yes see MAR    Labs/applicable tests checked: CBC and Comprehensive Metabolic Panel (CMP)    Chemo regimen: C3D1 Rapid Rituxan    Rate verified and armband double check with second RN: yes    Patient education offered and stated understanding. Denies questions at this time.      Patient presented to clinic for treatment. 24G Peripheral IV placed with positive blood return. Patient's labs okay to treat. Patient tolerated infusion well. Peripheral IV flushed with saline and removed. Patient discharged off unit in stable condition.

## 2021-11-20 NOTE — Progress Notes
Name: Crystal Cameron          MRN: [id number]      DOB: November 24, 1955      AGE: 66 y.o.   DATE OF SERVICE: 11/20/2021    Subjective:             Reason for Visit:  Heme/Onc Care      Crystal Cameron is a 66 y.o. female.     Cancer Staging  No matching staging information was found for the patient.    History of Present Illness    Crystal Cameron is a patient of Dr. McKittrick's with the following oncology history:    [name]'s husband had noticed when he put her arm across Crystal Cameron;s abdomen that he thought he felt a mass.  They went to see the doctor.  A CT scan in May 15, 2021 showed the spleen of 620 x 19 cm.  The liver looked normal.  There was no adenopathy.  Lab work about that same time showed a hemoglobin 11.2, platelets of 99, and ANC of 2300 and lymphocytopenia and a white count of 2.8.  The patient has been trying to lose weight and maybe lost 10 pounds in the last 4 months.  She endorsed some sweats at night but that was not necessarily new.  After the diagnosis of marginal zone lymphoma from a bone marrow biopsy on June 11, 2021, the patient had a PET scan that was otherwise unrevealing.  She started rituximab on July 16, 2021 and completed 8 doses of rituximab.     Follow up CT Abdomen/pelvis on 10/23/21 showed decrease in the size of now mild splenomeglay. Unchanged left likely low density area involving the superior lateral aspect of the spleen which might represent splenic cleft or sequelae of prior infarction. No significant abdominal/pelvic adenopathy or ascites.    Interval Events:  Crystal Cameron is here to receive her first cycle of maintenance rituxan. She is with her husband, JD. She reports that before Christmas she had Covid. She took paxlovid and recovered pretty quickly. However, she then developed rebound Covid. She had a lot more sinus congestion the second time around along with fatigue. She is feeling much better but isn't quite back to baseline. She is starting to exercise more and is increasing the amount she does daily. Remaining active around [place] and continues to do chores. No fevers, chills, drenching night sweats or unintentional weight loss.  She is anxious to resume her rituxan treatment as she had to delay three weeks due to covid.  ?     Review of Systems   Constitutional: Positive for fatigue. Negative for fever.   Respiratory: Negative for cough and shortness of breath.    Gastrointestinal: Negative for abdominal distention, abdominal pain and constipation.   Neurological: Negative for headaches.   Hematological: Negative for adenopathy.         Objective:         ? acetaminophen (TYLENOL EXTRA STRENGTH) 500 mg tablet Take 500 mg by mouth as Needed for Pain. Max of 4,000 mg of acetaminophen in 24 hours.   ? IBUPROFEN PO Take  by mouth as Needed.   ? KRILL OIL PO Take 350 mg by mouth daily.   ? lutein 20 mg tab Take 20 mg by mouth daily.   ? Multivitamins with Fluoride (MULTI-VITAMIN PO) Take  by mouth daily.   ? sertraline (ZOLOFT) 50 mg tablet Take 50 mg by mouth daily.     Vitals:  11/20/21 0852   BP: 133/80   BP Source: Arm, Left Upper   Pulse: 70   Temp: 36.8 ?C (98.3 ?F)   Resp: 18   SpO2: 100%   TempSrc: Oral   PainSc: Zero   Weight: 73.8 kg (162 lb 9.6 oz)     Body mass index is 28.99 kg/m?Marland Kitchen     Pain Score: Zero            Pain Addressed:  N/A     Patient Evaluated for a Clinical Trial: No treatment clinical trial available for this patient.     [place] Cooperative Oncology Group performance status is 0, Fully active, able to carry on all pre-disease performance without restriction.Marland Kitchen     Physical Exam  Vitals reviewed.   Constitutional:       Appearance: Normal appearance. She is normal weight.   HENT:      Head: Normocephalic and atraumatic.   Cardiovascular:      Rate and Rhythm: Normal rate and regular rhythm.      Heart sounds: Normal heart sounds.   Pulmonary:      Effort: Pulmonary effort is normal.      Breath sounds: Normal breath sounds.   Abdominal: General: Bowel sounds are normal. There is no distension.      Palpations: Abdomen is soft. There is no mass.      Comments: Spleen not palpable.   Musculoskeletal:      Right lower leg: No edema.      Left lower leg: No edema.   Lymphadenopathy:      Head:      Right side of head: No submandibular adenopathy.      Left side of head: No submandibular adenopathy.      Cervical: No cervical adenopathy.      Upper Body:      Right upper body: No supraclavicular or axillary adenopathy.      Left upper body: No supraclavicular or axillary adenopathy.   Skin:     General: Skin is warm and dry.   Neurological:      Mental Status: She is alert and oriented to person, place, and time.          CBC w diff  CBC with Diff Latest Ref Rng & Units 11/20/2021 09/11/2021 09/03/2021 08/27/2021 08/20/2021   WBC 4.5 - 11.0 K/UL 4.0(L) 3.5(L) 3.4(L) 3.5(L) 4.2(L)   RBC 4.0 - 5.0 M/UL 4.55 4.78 4.72 4.70 4.68   HGB 12.0 - 15.0 GM/DL 19.1 47.8 29.5 62.1 30.8   HCT 36 - 45 % 39.2 40.1 39.0 38.4 38.2   MCV 80 - 100 FL 86.2 83.9 82.5 81.6 81.7   MCH 26 - 34 PG 28.6 27.5 27.4 26.7 26.5   MCHC 32.0 - 36.0 G/DL 65.7 84.6 96.2 95.2 84.1   RDW 11 - 15 % 14.0 20.5(H) 21.1(H) 21.0(H) 20.8(H)   PLT 150 - 400 K/UL 199 200 191 191 211   MPV 7 - 11 FL 8.3 8.0 8.1 7.7 7.7   NEUT 41 - 77 % 75 67 71 69 69   ANC 1.8 - 7.0 K/UL 3.00 2.40 2.50 2.40 2.90   LYMA 24 - 44 % 17(L) 23(L) 21(L) 22(L) 22(L)   ALYM 1.0 - 4.8 K/UL 0.70(L) 0.80(L) 0.70(L) 0.80(L) 0.90(L)   MONA 4 - 12 % 5 7 5 7 6    AMONO 0 - 0.80 K/UL 0.20 0.20 0.20 0.20 0.20   EOSA 0 - 5 % 2 2  2 1 2    AEOS 0 - 0.45 K/UL 0.10 0.10 0.10 0.00 0.10   BASA 0 - 2 % 1 1 1 1 1    ABAS 0 - 0.20 K/UL 0.00 0.00 0.00 0.00 0.00     Comprehensive Metabolic Profile  CMP Latest Ref Rng & Units 09/11/2021 09/03/2021 08/27/2021 08/20/2021 08/13/2021   NA 137 - 147 MMOL/L 140 140 139 137 138   K 3.5 - 5.1 MMOL/L 4.2 3.9 4.5 3.9 4.1   CL 98 - 110 MMOL/L 101 102 101 100 100   CO2 21 - 30 MMOL/L 31(H) 29 31(H) 30 29   GAP 3 - 12 8 9 7 7 9    BUN 7 - 25 MG/DL 18 20 18 15 19    CR 0.4 - 1.00 MG/DL 0.27 2.53 6.64 4.03 4.74   GLUX 70 - 100 MG/DL 79 25(Z) 83 72 75   CA 8.5 - 10.6 MG/DL 56.3 9.6 9.6 9.3 9.5   TP 6.0 - 8.0 G/DL 7.0 7.1 6.8 6.7 6.8   ALB 3.5 - 5.0 G/DL 4.5 4.3 4.3 4.2 4.3   ALKP 25 - 110 U/L 65 65 66 67 68   ALT 7 - 56 U/L 12 12 12 12 9    TBILI 0.3 - 1.2 MG/DL 0.7 0.6 0.6 0.7 0.6             Assessment and Plan:    Marginal zone lymphoma diagnosed in July 2022. The patient received rituxan weekly x 8. Follow up CT abdomen/pelvis showed improvement in splenomelgaly and no significant abdominopelvic lymphadenopathy.    Proceed with Cycle 1 of maintenance rituxan on 11/20/21.    No clinical evidence of recurrence on examination.    Leukopenia: WBC 4.0 and ALC 700. Repeat prior to each treatment.    FEN/Renal:  CMP on 09/11/21 showed lecture lites, creatinine, and transaminases within normal range.  Eating and drinking well.  CMP pending 11/20/2021.    Covid infection: Recovering with lingering fatigue and nasal congestion. Recommend mucinex and claritin if needed. Also, recommend continuing to increase activity as tolerated.    RTC: 2 months for MD visit, labs and treatment.

## 2021-11-20 NOTE — Patient Instructions
Call Immediately to report the following:  Uncontrolled nausea and/or vomiting, uncontrolled pain, or unusual bleeding.  Temperature of 100.4 F or greater and/or any sign/symptom of infection (redness, warmth, tenderness)  Painful mouth or difficulty swallowing  Red, cracked, or painful hands and/or feet  Diarrhea   Swelling of arms or legs  Rash    Important Phone Numbers:  [facility] Main Number (answered 24 hours a day) [phone]  Cancer Center Scheduling (appointments) [phone] OR [phone]  [facility] (for nutritional supplements) [phone]        Port Maintenance - If you have a port, it should be flushed every 6-8 weeks when not in use.  Please check with your MD, nurse, or the scheduler.

## 2022-01-15 ENCOUNTER — Encounter: Admit: 2022-01-15 | Discharge: 2022-01-15 | Payer: MEDICARE

## 2022-01-15 DIAGNOSIS — C8580 Other specified types of non-Hodgkin lymphoma, unspecified site: Secondary | ICD-10-CM

## 2022-01-15 DIAGNOSIS — C4490 Unspecified malignant neoplasm of skin, unspecified: Secondary | ICD-10-CM

## 2022-01-15 DIAGNOSIS — H547 Unspecified visual loss: Secondary | ICD-10-CM

## 2022-01-15 DIAGNOSIS — C801 Malignant (primary) neoplasm, unspecified: Secondary | ICD-10-CM

## 2022-01-15 LAB — COMPREHENSIVE METABOLIC PANEL
ALBUMIN: 4.4 g/dL (ref 3.5–5.0)
ALK PHOSPHATASE: 66 U/L (ref 25–110)
ALT: 10 U/L — ABNORMAL LOW (ref 7–56)
ANION GAP: 8 K/UL (ref 3–12)
CREATININE: 0.9 mg/dL (ref 0.4–1.00)
GLUCOSE,PANEL: 94 mg/dL (ref 70–100)
POTASSIUM: 3.9 MMOL/L (ref 3.5–5.1)
TOTAL BILIRUBIN: 0.7 mg/dL — ABNORMAL LOW (ref 0.3–1.2)
TOTAL PROTEIN: 7.2 g/dL (ref 6.0–8.0)

## 2022-01-15 LAB — IMMUNOGLOBULIN G (IGG): IGG: 103 mg/dL (ref 762–1488)

## 2022-01-15 LAB — CBC AND DIFF
ABSOLUTE BASO COUNT: 0 K/UL (ref 0–0.20)
ABSOLUTE EOS COUNT: 0 K/UL (ref >60)
ABSOLUTE NEUTROPHIL: 3.4 K/UL (ref 1.8–7.0)
BASOPHILS %: 1 % (ref 0–2)
HEMATOCRIT: 40 % (ref 36–45)
HEMOGLOBIN: 13 g/dL (ref 12.0–15.0)
MPV: 8.8 FL (ref 7–11)
RBC COUNT: 4.7 M/UL (ref 4.0–5.0)
RDW: 14 % (ref 11–15)
WBC COUNT: 4.6 K/UL (ref 4.5–11.0)

## 2022-01-15 MED ORDER — ACETAMINOPHEN 325 MG PO TAB
650 mg | Freq: Once | ORAL | 0 refills | Status: CP
Start: 2022-01-15 — End: ?
  Administered 2022-01-15: 17:00:00 650 mg via ORAL

## 2022-01-15 MED ORDER — RITUXIMAB-PVVR IVPB (SPEC VOL)
375 mg/m2 | Freq: Once | INTRAVENOUS | 0 refills | Status: CP
Start: 2022-01-15 — End: ?
  Administered 2022-01-15 (×3): 700 mg via INTRAVENOUS

## 2022-01-15 MED ORDER — DIPHENHYDRAMINE HCL 25 MG PO CAP
25 mg | Freq: Once | ORAL | 0 refills | Status: CP
Start: 2022-01-15 — End: ?
  Administered 2022-01-15: 17:00:00 25 mg via ORAL

## 2022-01-15 NOTE — Progress Notes
Name: Crystal Cameron          MRN: [id number]      DOB: Aug 25, 1956      AGE: 66 y.o.   DATE OF SERVICE: 01/15/2022    Subjective:             Reason for Visit:  Follow Up      Crystal Cameron is a 66 y.o. female.      Cancer Staging   No matching staging information was found for the patient.    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  ?  She felt a mass in her abdomen. ?CAT scan?on?July 1 showed massive enlargement of?the spleen?to?20 x 19 cm. ?The liver looked normal. ?There?was no adenopathy. ?Lab work showed a hemoglobin 11.2, platelets of 99, ANC of 2300,?lymphocytopenia and a white count of 2.8. ?The patient has lost 10 pounds in the last 4 months. ?She?denies night sweats, rash, itching. ?  ?  PET on 06/25/21 showed marked splenomegaly with diffuse hypermetabolism compatible with lymphoma. ?No LAD. ?BM bx findings on 06/09/21 favored marginal zone lymphoma, 5-10% involvement. ?  ?  She reports abdominal fullness, early satiety and fatigue.??  ?  -- ?07/16/21 ?Start weekly Rituxan  --  11/20/21  Started maintenance Rituxan (Q2 mos x2 years)  ?  Interim ?Hx:  No new sx or concerns.  Completely recovered from COVID a couple months ago.         Review of Systems   Constitutional: Negative.  Negative for diaphoresis, fever and unexpected weight change.   Respiratory: Negative.    Cardiovascular: Negative.    Gastrointestinal: Negative.         Indigestion occasionally       Genitourinary: Negative.    Musculoskeletal: Negative.    Skin: Negative.    Neurological: Negative.    Hematological: Negative.    Psychiatric/Behavioral: Negative.    All other systems reviewed and are negative.        Objective:         ? acetaminophen (TYLENOL EXTRA STRENGTH) 500 mg tablet Take one tablet by mouth as Needed for Pain. Max of 4,000 mg of acetaminophen in 24 hours.   ? KRILL OIL PO Take 350 mg by mouth daily.   ? lutein 20 mg tab Take one tablet by mouth daily.   ? Multivitamins with Fluoride (MULTI-VITAMIN PO) Take  by mouth daily.   ? sertraline (ZOLOFT) 50 mg tablet Take one tablet by mouth daily.     Vitals:    01/15/22 0957 01/15/22 0958   BP: (!) 142/74    BP Source: Arm, Right Upper    Pulse: 70    Temp: 36.8 ?C (98.2 ?F)    Resp: 18    SpO2: 99%    O2 Device:  None (Room air)   TempSrc: Oral    PainSc: Zero Zero   Weight: 74.4 kg (164 lb)      Body mass index is 29.24 kg/m?Marland Kitchen     Pain Score: Zero       Fatigue Scale: 0-None    Pain Addressed:  N/A    Patient Evaluated for a Clinical Trial: No treatment clinical trial available for this patient.     [place] Cooperative Oncology Group performance status is 0, Fully active, able to carry on all pre-disease performance without restriction.Marland Kitchen     Physical Exam  Vitals reviewed.   Constitutional:  Appearance: Normal appearance.   HENT:      Head: Normocephalic.   Eyes:      Extraocular Movements: Extraocular movements intact.   Cardiovascular:      Rate and Rhythm: Normal rate.   Pulmonary:      Effort: Pulmonary effort is normal.      Breath sounds: Normal breath sounds.   Abdominal:      General: Abdomen is flat.      Palpations: Abdomen is soft. There is no mass.      Tenderness: There is no abdominal tenderness.      Comments: No splenomegaly     Musculoskeletal:         General: Normal range of motion.      Cervical back: Normal range of motion.   Lymphadenopathy:      Cervical: No cervical adenopathy.   Skin:     General: Skin is warm and dry.   Neurological:      General: No focal deficit present.      Mental Status: She is alert and oriented to person, place, and time.   Psychiatric:         Mood and Affect: Mood normal.         Behavior: Behavior normal.         Thought Content: Thought content normal.          Results for orders placed or performed during the hospital encounter of 01/15/22 (from the past 336 hour(s))   CBC AND DIFF   Result Value Ref Range    White Blood Cells 4.6 4.5 - 11.0 K/UL    RBC 4.72 4.0 - 5.0 M/UL    Hemoglobin 13.8 12.0 - 15.0 GM/DL    Hematocrit 24.4 36 - 45 %    MCV 86.5 80 - 100 FL    MCH 29.3 26 - 34 PG    MCHC 33.8 32.0 - 36.0 G/DL    RDW 01.0 11 - 15 %    Platelet Count 196 150 - 400 K/UL    MPV 8.8 7 - 11 FL    Neutrophils 74 41 - 77 %    Lymphocytes 18 (L) 24 - 44 %    Monocytes 6 4 - 12 %    Eosinophils 1 0 - 5 %    Basophils 1 0 - 2 %    Absolute Neutrophil Count 3.40 1.8 - 7.0 K/UL    Absolute Lymph Count 0.80 (L) 1.0 - 4.8 K/UL    Absolute Monocyte Count 0.30 0 - 0.80 K/UL    Absolute Eosinophil Count 0.00 0 - 0.45 K/UL    Absolute Basophil Count 0.00 0 - 0.20 K/UL          Assessment and Plan:       Patient Active Problem List    Diagnosis Date Noted   ? Pancytopenia (HCC) 06/12/2021     06/12/2021 likely related to bone marrow involvement by marginal zone lymphoma.     ? Marginal zone lymphoma [facility]) 05/26/2021     05/14/2021 [facility] laboratory creatinine 1.1, transaminases alk phos total bili normal, TSH 1.1, W2.8 low, H11.2 Below 12, MCV 82.1, platelets 99 low, ANC 2300, lymphocytes 300 slightly low,  05/15/2021 [facility] in [place] CT abdomen pelvis liver is normal sign.  Spleen is massively enlarged measuring 20 cm x 19 cm.  No focal splenic lesions.  No adenopathy.  Diverticulosis throughout the sigmoid colon.  05/20/2021  [facility] medical clinic, [place].  66 year old female to review CAT scan that showed massive splenomegaly.  Lab work showed pancytopenia.  Patient says she has lost weight maybe 55 pounds since last fall.  Also reports weight loss and night sweats.  05/20/2021 [facility] BCR/ABL 1 and 2 not detected  05/22/2021 osl showed W2.3, H10.5, MCV 82, platelets 129, MPV 11.2 slightly elevated, 78% neutrophils  05/29/2021 [facility] peripheral smear pancytopenia.  06/03/2021 this is patient's first visit to [facility] office.  Reviewed imaging showing enlarged spleen along with exam feeling palpable spleen.  Talked about peripheral smear not being helpful.  We will arrange for bone marrow biopsy with conscious sedation and follow-up about 3 weeks later.  She is aware that I am concerned about possible lymphoproliferative sorter or myeloproliferative disorder.  Her mother died from a T-cell lymphoma at an older age.  Her mother also had melanoma.  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  See comment.  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 - - - - consider PET and treatment with BR  06/12/2021 called and talked with patient about results of bone marrow showing marginal zone lymphoma.  Mention plans for PET scan.  Consider Bendamustine rituximab given every 4 weeks x 6 cycles.  Talked about risk for infection due to marginal zone lymphoma and pancytopenia but also risk of infection with both rituximab lymphocyte depletion as well as potential neutropenia with Bendamustine.  We will also do more think about whether single agent rituximab versus BR versus B TK inhibitor would be appropriate.  We will call patient after PET scan and arrange for teaching of appropriate therapy.  Patient will ask questions.  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  06/25/2021  PET scan spleen a megaly with diffuse FDG uptake maximal SUV of 6.6 measuring 21 cm previously 20 cm.  No hypermetabolic lymphadenopathy.  06/25/2021 tried reaching patient.  No answer.  We will check to see if she has had HCV testing.  We will also check HBV testing.  If HCV positive might consider antiretroviral therapy.  If negative would consider rituximab single agent.  06/30/2021 [facility] hepatitis C virus antibody 0.2, negative.  Hepatitis B surface antigen screen negative, hepatitis B core antibody screen negative  07/06/2021 orders entered for Bendamustine rituximab with plans for 6 cycles depending upon tolerance and counts.  07/08/2021 discussed case with Dr. Mikey Bussing.  Will change treatment plan to rituximab 375 mg/metered squared for 4-8 weekly doses depending upon tolerance and benefit and if tolerated probably 4 additional doses at 33-month intervals or some other form of maintenance for 2 years.  This was discussed with the patient.  Orders will be entered.  We will also place orders for vaccines for Streptococcus, Neisseria meningitidis and haemophilus influenza.  07/15/21  Rituxan education.  Start tomorrow.  Pre-splenectomy vaccinations today.  Start allopurinol today.  1 week tox check.  FU with Dr. Madison Hickman in about 4 weeks.    07/23/21  Week 2 Rituxan.  Marked improvement in palpable spleen size.  Lab improved.  Sx significantly improved.  Continue weekly Rituxan and lab.   FU with Dr. Madison Hickman in 3 weeks as scheduled  08/13/2021 here for week 5 of planned date of Rituxan to be then followed  by additional doses may be up to 2 years.  Has a mouth sore that may be viral.  We will probably try her antiviral.  If that does not work there is a mouth paste we may try.  She says belly began feeling better about 3 days after receiving therapy.  Energy improving.  Return to see [place] in 2 in 4 weeks.  Will maybe consider scan 2 months after that 1 more ready to start the next maintenance therapy.  09/11/2021 patient feeling much better.  Exam much improved.  Spleen at the edge of the ribs on inspiration.  We will plan on every 2 month Rituxan for the next 2 years.  We will do a scan before return in 2 months.  To call if questions.  11/20/21: Doing well; mostly recovered from covid/rebound covid. Here for C1 of maintenance rituxan. Scans reviewed. Dr. Madison Hickman in two months with next treamtnet.   SPEP M spike 0.13  10/23/2021 [place] CT AP: Interval decrease in size of now mild splenomegaly.  Unchanged left likely low-density area involving the superior lateral aspect of spleen.  No significant abdominal pelvic adenopathy or ascites.  Spleen now measures 13.4 x 6.9 x 13 cm compared to 16.1 x 8.5 x 20.1.  01/15/22  #2 maintenance Rituxan.  No s/s progressive lymphoma.  Spleen non palpable.  Stable lab.  FU with Dr. Madison Hickman in 2 mos w/ lab.        PMH: Generalized anxiety disorder    SH: Patient and husband both from the same [place] town [place] believe at [facility].  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

## 2022-01-15 NOTE — Progress Notes
PATIENT GIVEN AVS 01/15/22 11:04 AM

## 2022-01-15 NOTE — Progress Notes
Day 1 Cycle 4 Rituxan       Patient to follow up with Mickel Baas, NP  OK to treat, labs OK to treat.  PIV started, brisk blood return.  Tolerated infusion well.   PIV DC'd intact.   Discharged in stable condition.       CHEMO NOTE  Verified chemo consent signed and in chart.    Blood return positive via: Peripheral (24 ga)    BSA and dose double checked (agree with orders as written) with: yes     Labs/applicable tests checked: CBC and Comprehensive Metabolic Panel (CMP)    Chemo regimen: Drug/cycle/day C4 D1 Rituxan    Rate verified and armband double check with second RN: yes, see eMar    Patient education offered and stated understanding. Denies questions at this time.

## 2022-01-15 NOTE — Patient Instructions
Call Immediately to report the following:  Uncontrolled nausea and/or vomiting, uncontrolled pain, or unusual bleeding.  Temperature of 100.4 F or greater and/or any sign/symptom of infection (redness, warmth, tenderness)  Painful mouth or difficulty swallowing  Red, cracked, or painful hands and/or feet  Diarrhea   Swelling of arms or legs  Rash    Important Phone Numbers:  [facility] Main Number (answered 24 hours a day) [phone]  Cancer Center Scheduling (appointments) [phone] OR [phone]  [facility] (for nutritional supplements) [phone]        Port Maintenance - If you have a port, it should be flushed every 6-8 weeks when not in use.  Please check with your MD, nurse, or the scheduler.

## 2022-03-08 ENCOUNTER — Encounter: Admit: 2022-03-08 | Discharge: 2022-03-08 | Payer: MEDICARE

## 2022-03-08 MED ORDER — DIPHENHYDRAMINE HCL 25 MG PO CAP
25 mg | Freq: Once | ORAL | 0 refills
Start: 2022-03-08 — End: ?

## 2022-03-08 MED ORDER — ACETAMINOPHEN 325 MG PO TAB
650 mg | Freq: Once | ORAL | 0 refills
Start: 2022-03-08 — End: ?

## 2022-03-08 MED ORDER — RITUXIMAB IVPB (SPEC VOL)
375 mg/m2 | Freq: Once | INTRAVENOUS | 0 refills
Start: 2022-03-08 — End: ?

## 2022-03-12 ENCOUNTER — Encounter: Admit: 2022-03-12 | Discharge: 2022-03-12 | Payer: MEDICARE

## 2022-03-12 DIAGNOSIS — C8580 Other specified types of non-Hodgkin lymphoma, unspecified site: Secondary | ICD-10-CM

## 2022-03-12 DIAGNOSIS — C4490 Unspecified malignant neoplasm of skin, unspecified: Secondary | ICD-10-CM

## 2022-03-12 DIAGNOSIS — C801 Malignant (primary) neoplasm, unspecified: Secondary | ICD-10-CM

## 2022-03-12 DIAGNOSIS — H547 Unspecified visual loss: Secondary | ICD-10-CM

## 2022-03-12 LAB — COMPREHENSIVE METABOLIC PANEL
ALBUMIN: 4.4 g/dL — ABNORMAL LOW (ref 3.5–5.0)
ALK PHOSPHATASE: 66 U/L (ref 25–110)
ALT: 9 U/L (ref 7–56)
ANION GAP: 8 K/UL — ABNORMAL LOW (ref 3–12)
AST: 13 U/L (ref 7–40)
CALCIUM: 9.5 mg/dL (ref 8.5–10.6)
CO2: 29 MMOL/L (ref 21–30)
CREATININE: 0.8 mg/dL (ref 0.4–1.00)
EGFR: >60 mL/min (ref >60)
GLUCOSE,PANEL: 85 mg/dL (ref 70–100)
POTASSIUM: 3.9 MMOL/L (ref 3.5–5.1)
SODIUM: 140 MMOL/L (ref 137–147)
TOTAL BILIRUBIN: 0.6 mg/dL (ref 0.3–1.2)
TOTAL PROTEIN: 7.1 g/dL (ref 6.0–8.0)

## 2022-03-12 LAB — CBC AND DIFF
RBC COUNT: 4.9 M/UL (ref 4.0–5.0)
WBC COUNT: 4.3 K/UL — ABNORMAL LOW (ref 4.5–11.0)

## 2022-03-12 LAB — LDH-LACTATE DEHYDROGENASE: LDH: 185 U/L (ref 100–210)

## 2022-03-12 LAB — IMMUNOGLOBULIN G (IGG): IGG: 102 mg/dL (ref 762–1488)

## 2022-03-12 MED ORDER — ACETAMINOPHEN 325 MG PO TAB
650 mg | Freq: Once | ORAL | 0 refills | Status: CP
Start: 2022-03-12 — End: ?
  Administered 2022-03-12: 16:00:00 650 mg via ORAL

## 2022-03-12 MED ORDER — DIPHENHYDRAMINE HCL 25 MG PO CAP
25 mg | Freq: Once | ORAL | 0 refills | Status: CP
Start: 2022-03-12 — End: ?
  Administered 2022-03-12: 16:00:00 25 mg via ORAL

## 2022-03-12 MED ORDER — RITUXIMAB IVPB (SPEC VOL)
375 mg/m2 | Freq: Once | INTRAVENOUS | 0 refills | Status: CP
Start: 2022-03-12 — End: ?
  Administered 2022-03-12 (×3): 700 mg via INTRAVENOUS

## 2022-03-12 NOTE — Progress Notes
Pt arrived ambulatory to [facility] tx for C5 D1 Rituximab. Patient seen in clinic by Margreta Journey, NP  OK to treat, labs OK to treat. PIV started, brisk blood return.  Tolerated infusion well. PIV DC'd intact.   Discharged ambulatory in stable condition.     CHEMO NOTE  Verified chemo consent signed and in chart.    Blood return positive via: Peripheral (24 ga)    BSA and dose double checked (agree with orders as written) with: yes    Labs/applicable tests checked: CBC and Comprehensive Metabolic Panel (CMP)    Chemo regimen: Drug/cycle/day C5 D1 Rituximab (Rapid)    Rate verified and armband double check with second RN: yes, see emar    Patient education offered and stated understanding. Denies questions at this time.

## 2022-03-12 NOTE — Progress Notes
PATIENT GIVEN AVS 03/12/22 11:15 AM

## 2022-03-12 NOTE — Patient Instructions
Call Immediately to report the following:  Uncontrolled nausea and/or vomiting, uncontrolled pain, or unusual bleeding.  Temperature of 100.4 F or greater and/or any sign/symptom of infection (redness, warmth, tenderness)  Painful mouth or difficulty swallowing  Red, cracked, or painful hands and/or feet  Diarrhea   Swelling of arms or legs  Rash    Important Phone Numbers:  [facility] Main Number (answered 24 hours a day) [phone]  Cancer Center Scheduling (appointments) [phone] or [phone]  [facility] (for nutritional supplements) [phone]        Port Maintenance - If you have a port, it should be flushed every 6-8 weeks when not in use.  Please check with your MD, nurse, or the scheduler.

## 2022-03-12 NOTE — Progress Notes
Name: Crystal Cameron          MRN: [id number]      DOB: 1956/02/08      AGE: 66 y.o.   DATE OF SERVICE: 03/12/2022    Subjective:             Reason for Visit:  Heme/Onc Care      Crystal Cameron is a 66 y.o. female.       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  Her bone marrow was showed marginal lymphoma and the PET scan was otherwise normal except for the spleen    Crystal Cameron is here with her husband [name]  They had noticed when he put her arm across her abdomen that he thought he felt a mass.  They gone to see the doctor.  I CAT scan in July 1 showed the spleen of 620 x 19 cm.  The liver looked normal.  There is no adenopathy.  Lab work about that same time showed a hemoglobin 11.2, platelets of 99, and ANC of 2300 and lymphocytopenia and a white count of 2.8.  The patient has been trying to lose weight and maybe lost 10 pounds in the last 4 months.  She does have some sweats at night but that does not psych that is necessarily new.  She is not aware of any skin rashes.  After the diagnosis of marginal zone lymphoma from a bone marrow biopsy on June 11, 2021 the patient had a PET scan that was otherwise unrevealing.  After long discussion the patient began rituximab on July 16, 2021.  The patient then began maintenance rituximab.  A scan in late December 2022 showed improvement in her splenomegaly.    The patient is here for cycle 5 out of planned 8 rituximab.  Her energy is improving.  She is working outside on the farm.  She denies any unexplained fevers or chills or nausea or vomiting.  Her exam is benign without any enlarged lymph nodes.    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  There is also a preemie who was 15 pounds at birth that they have named [place].  They are not sure if the eyes are developing or not.    We will see the patient back in 2 months for another rituximab and then I will see her back in 4 months.  ==========================================  The patient has a history of skin cancer and also generalized anxiety disorder.    The patient is originally from around [facility].  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    The patient's past medical, surgical, social, and family histories were reviewed with the patient at this visit. I also reviewed the recent laboratory, pathology, and radiological studies.  The patient is at risk of complications from her disease as well as complication of summer therapy.  I think she will benefit from ongoing surveillance of her physical exam, lab work and radiologic studies.  ==========================================    The patient will return in 2 months for her next Rituxan Mab given once every 2 months for the next 2 years.  The patient will see [place] again in 2 months in 4 months.  Review of Systems   Constitutional: Negative for chills and fever.   HENT: Negative for nosebleeds.    Eyes: Negative for pain and visual disturbance.   Respiratory: Negative for cough and shortness of breath.    Cardiovascular: Negative for chest pain and leg swelling.   Gastrointestinal: Negative for abdominal pain.   Genitourinary: Negative for hematuria.   Musculoskeletal: Negative for joint swelling and neck pain.   Skin: Negative for rash.   Neurological: Negative for dizziness and weakness.   Hematological: Negative for adenopathy.   Psychiatric/Behavioral: Negative for confusion.         Objective:           ? acetaminophen (TYLENOL EXTRA STRENGTH) 500 mg tablet Take one tablet by mouth as Needed for Pain. Max of 4,000 mg of acetaminophen in 24 hours.   ? BIOTIN PO Take 6,000 mcg by mouth daily.   ? KRILL OIL PO Take 350 mg by mouth daily.   ? lutein 20 mg tab Take one tablet by mouth daily.   ? Multivitamins with Fluoride (MULTI-VITAMIN PO) Take  by mouth daily.   ? sertraline (ZOLOFT) 50 mg tablet Take one tablet by mouth daily.       Vitals:    03/12/22 0959   BP: 136/72   BP Source: Arm, Left Upper   Pulse: 72   Temp: 36.8 ?C (98.3 ?F)   Resp: 16   SpO2: 100%   TempSrc: Oral   PainSc: Zero   Weight: 74.2 kg (163 lb 9.6 oz)     Body mass index is 29.17 kg/m?Marland Kitchen     Medical History:   Diagnosis Date   ? Cancer [facility])    ? Cancer of skin    ? Vision decreased      Surgical History:   Procedure Laterality Date   ? CESAREAN SECTION     ? HAND SURGERY      BONE GRAFT   ? HX APPENDECTOMY     ? TONSILLECTOMY        Social History     Tobacco Use   ? Smoking status: Never   ? Smokeless tobacco: Never   Vaping Use   ? Vaping Use: Never used   Substance Use Topics   ? Alcohol use: Never   ? Drug use: Never     Family History   Problem Relation Age of Onset   ? Cancer Mother 77   ? Arthritis-rheumatoid Mother 78   ? Arthritis-osteo Mother 14   ? Cancer-Prostate Father    ? Cancer Maternal Aunt 19   ? Cancer Maternal Grandmother 89         Pain Score: Zero            Pain Addressed:  N/A    Patient Evaluated for a Clinical Trial: No treatment clinical trial available for this patient.     [place] Cooperative Oncology Group performance status is 1, Restricted in physically strenuous activity but ambulatory and able to carry out work of a light or sedentary nature, e.g., light house work, office work.     Physical Exam  Vitals reviewed.   Constitutional:       General: She is not in acute distress.     Appearance: Normal appearance. She is well-developed and normal weight. She is not ill-appearing.   HENT:      Head: Normocephalic and atraumatic.   Eyes:      Conjunctiva/sclera: Conjunctivae normal.   Neck:  Vascular: No JVD.      Trachea: No tracheal deviation.   Cardiovascular:      Rate and Rhythm: Normal rate and regular rhythm.      Heart sounds:     No friction rub. No gallop.   Pulmonary:      Effort: Pulmonary effort is normal. No respiratory distress. Breath sounds: Normal breath sounds. No stridor. No wheezing or rales.   Abdominal:      General: There is no distension.      Palpations: Abdomen is soft. There is no mass.      Tenderness: There is no abdominal tenderness.   Musculoskeletal:         General: Normal range of motion.      Cervical back: Normal range of motion. No rigidity.      Right lower leg: No edema.      Left lower leg: No edema.   Lymphadenopathy:      Head:      Right side of head: No submental or submandibular adenopathy.      Left side of head: No submental or submandibular adenopathy.      Cervical: No cervical adenopathy.      Right cervical: No superficial or posterior cervical adenopathy.     Left cervical: No superficial or posterior cervical adenopathy.      Upper Body:      Right upper body: No supraclavicular or epitrochlear adenopathy.      Left upper body: No supraclavicular or epitrochlear adenopathy.   Skin:     General: Skin is warm and dry.      Findings: No bruising, lesion or rash.   Neurological:      Mental Status: She is alert and oriented to person, place, and time.      Cranial Nerves: No cranial nerve deficit.      Motor: No weakness.      Gait: Gait normal.   Psychiatric:         Mood and Affect: Mood normal.         Behavior: Behavior normal.         Thought Content: Thought content normal.         Judgment: Judgment normal.               Assessment and Plan:  Patient Active Problem List    Diagnosis Date Noted   ? Marginal zone lymphoma (HCC) 05/26/2021     Priority: Medium     05/14/2021 [facility] laboratory creatinine 1.1, transaminases alk phos total bili normal, TSH 1.1, W2.8 low, H11.2 Below 12, MCV 82.1, platelets 99 low, ANC 2300, lymphocytes 300 slightly low,  05/15/2021 [facility] in [place] CT abdomen pelvis liver is normal sign.  Spleen is massively enlarged measuring 20 cm x 19 cm.  No focal splenic lesions.  No adenopathy.  Diverticulosis throughout the sigmoid colon.  05/20/2021 [facility] medical clinic, [place] Nab. [place].  66 year old female to review CAT scan that showed massive splenomegaly.  Lab work showed pancytopenia.  Patient says she has lost weight maybe 55 pounds since last fall.  Also reports weight loss and night sweats.  05/20/2021 [facility] BCR/ABL 1 and 2 not detected  05/22/2021 osl showed W2.3, H10.5, MCV 82, platelets 129, MPV 11.2 slightly elevated, 78% neutrophils  05/29/2021 [facility] peripheral smear pancytopenia.  06/03/2021 this is patient's first visit to [facility] office.  Reviewed  imaging showing enlarged spleen along with exam feeling palpable spleen.  Talked about peripheral smear not being helpful.  We will arrange for bone marrow biopsy with conscious sedation and follow-up about 3 weeks later.  She is aware that I am concerned about possible lymphoproliferative sorter or myeloproliferative disorder.  Her mother died from a T-cell lymphoma at an older age.  Her mother also had melanoma.  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  See comment.  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 - - - - consider PET and treatment with BR  06/12/2021 called and talked with patient about results of bone marrow showing marginal zone lymphoma.  Mention plans for PET scan.  Consider Bendamustine rituximab given every 4 weeks x 6 cycles.  Talked about risk for infection due to marginal zone lymphoma and pancytopenia but also risk of infection with both rituximab lymphocyte depletion as well as potential neutropenia with Bendamustine.  We will also do more think about whether single agent rituximab versus BR versus B TK inhibitor would be appropriate.  We will call patient after PET scan and arrange for teaching of appropriate therapy.  Patient will ask questions.  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  06/25/2021 [facility] PET scan spleen a megaly with diffuse FDG uptake maximal SUV of 6.6 measuring 21 cm previously 20 cm.  No hypermetabolic lymphadenopathy.  06/25/2021 tried reaching patient.  No answer.  We will check to see if she has had HCV testing.  We will also check HBV testing.  If HCV positive might consider antiretroviral therapy.  If negative would consider rituximab single agent.  06/30/2021 [facility] hepatitis C virus antibody 0.2, negative.  Hepatitis B surface antigen screen negative, hepatitis B core antibody screen negative  07/06/2021 orders entered for Bendamustine rituximab with plans for 6 cycles depending upon tolerance and counts.  07/08/2021 discussed case with Dr. Mikey Bussing.  Will change treatment plan to rituximab 375 mg/metered squared for 4-8 weekly doses depending upon tolerance and benefit and if tolerated probably 4 additional doses at 27-month intervals or some other form of maintenance for 2 years.  This was discussed with the patient.  Orders will be entered.  We will also place orders for vaccines for Streptococcus, Neisseria meningitidis and haemophilus influenza.  07/15/21  Rituxan education.  Start tomorrow.  Pre-splenectomy vaccinations today.  Start allopurinol today.  1 week tox check.  FU with Dr. Madison Hickman in about 4 weeks.    07/23/21  Week 2 Rituxan.  Marked improvement in palpable spleen size.  Lab improved.  Sx significantly improved.  Continue weekly Rituxan and lab.   FU with Dr. Madison Hickman in 3 weeks as scheduled  08/13/2021 here for week 5 of planned date of Rituxan to be then followed by additional doses may be up to 2 years.  Has a mouth sore that may be viral.  We will probably try her antiviral.  If that does not work there is a mouth paste we may try.  She says belly began feeling better about 3 days after receiving therapy.  Energy improving.  Return to see [place] in 2 in 4 weeks.  Will maybe consider scan 2 months after that 1 more ready to start the next maintenance therapy.  09/11/2021 patient feeling much better.  Exam much improved.  Spleen at  the edge of the ribs on inspiration.  We will plan on every 2 month Rituxan for the next 2 years.  We will do a scan before return in 2 months.  To call if questions.  11/20/21: Doing well; mostly recovered from covid/rebound covid. Here for C1 of maintenance rituxan. Scans reviewed. Dr. Madison Hickman in two months with next treamtnet.   SPEP M spike 0.13  10/23/2021 [place] CT AP: Interval decrease in size of now mild splenomegaly.  Unchanged left likely low-density area involving the superior lateral aspect of spleen.  No significant abdominal pelvic adenopathy or ascites.  Spleen now measures 13.4 x 6.9 x 13 cm compared to 16.1 x 8.5 x 20.1.  01/15/22  #2 maintenance Rituxan.  No s/s progressive lymphoma.  Spleen non palpable.  Stable lab.  FU with Dr. Madison Hickman in 2 mos w/ lab.    03/12/2022 patient here for maintenance 5 of planned 8 of rituximab every 2 months.  Tolerating well.  Energy improving.  No unexplained fevers chills nausea or vomiting.  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  She will see [place] in 2 months in 4 months for additional rituximab.    PMH: Generalized anxiety disorder    SH: Patient and husband both from the same small town I believe at [facility].  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                                       ? Pancytopenia (HCC) 06/12/2021     Priority: Low     06/12/2021 likely related to bone marrow involvement by marginal zone lymphoma.     ? Zoster without complications 05/26/2021   ? Other specified anxiety disorders 05/26/2021      This note was created with partial dictation using a dragon dictation system, please notify us if you notice any errors of omissions or content.

## 2022-05-05 ENCOUNTER — Encounter: Admit: 2022-05-05 | Discharge: 2022-05-05 | Payer: MEDICARE

## 2022-05-05 DIAGNOSIS — C8580 Other specified types of non-Hodgkin lymphoma, unspecified site: Secondary | ICD-10-CM

## 2022-05-05 MED ORDER — DIPHENHYDRAMINE HCL 25 MG PO CAP
25 mg | Freq: Once | ORAL | 0 refills
Start: 2022-05-05 — End: ?

## 2022-05-05 MED ORDER — ACETAMINOPHEN 325 MG PO TAB
650 mg | Freq: Once | ORAL | 0 refills
Start: 2022-05-05 — End: ?

## 2022-05-05 MED ORDER — RITUXIMAB IVPB (SPEC VOL)
375 mg/m2 | Freq: Once | INTRAVENOUS | 0 refills
Start: 2022-05-05 — End: ?

## 2022-05-07 ENCOUNTER — Encounter: Admit: 2022-05-07 | Discharge: 2022-05-07 | Payer: MEDICARE

## 2022-05-07 DIAGNOSIS — C801 Malignant (primary) neoplasm, unspecified: Secondary | ICD-10-CM

## 2022-05-07 DIAGNOSIS — C4490 Unspecified malignant neoplasm of skin, unspecified: Secondary | ICD-10-CM

## 2022-05-07 DIAGNOSIS — C8580 Other specified types of non-Hodgkin lymphoma, unspecified site: Secondary | ICD-10-CM

## 2022-05-07 DIAGNOSIS — H547 Unspecified visual loss: Secondary | ICD-10-CM

## 2022-05-07 DIAGNOSIS — C8307 Small cell B-cell lymphoma, spleen: Secondary | ICD-10-CM

## 2022-05-07 LAB — COMPREHENSIVE METABOLIC PANEL
ALBUMIN: 4.3 g/dL (ref 3.5–5.0)
ANION GAP: 7 K/UL (ref 3–12)
AST: 14 U/L (ref 7–40)
BLD UREA NITROGEN: 15 mg/dL (ref 7–25)
CALCIUM: 9.5 mg/dL (ref 8.5–10.6)
CHLORIDE: 106 MMOL/L (ref 98–110)
CREATININE: 0.8 mg/dL (ref 0.4–1.00)
GLUCOSE,PANEL: 82 mg/dL (ref 70–100)
POTASSIUM: 4.1 MMOL/L (ref 3.5–5.1)
TOTAL BILIRUBIN: 0.6 mg/dL (ref 0.3–1.2)

## 2022-05-07 LAB — CBC AND DIFF
ABSOLUTE BASO COUNT: 0 K/UL (ref >60)
ABSOLUTE LYMPH COUNT: 0.7 K/UL — ABNORMAL LOW (ref 1.0–4.8)
BASOPHILS %: 1 % (ref 0–2)
HEMATOCRIT: 43 % (ref 36–45)
HEMOGLOBIN: 14 g/dL (ref 12.0–15.0)
LYMPHOCYTES %: 18 % — ABNORMAL LOW (ref 24–44)
MCV: 87 FL (ref 80–100)
WBC COUNT: 4.1 K/UL — ABNORMAL LOW (ref 4.5–11.0)

## 2022-05-07 MED ORDER — RITUXIMAB IVPB (SPEC VOL)
375 mg/m2 | Freq: Once | INTRAVENOUS | 0 refills | Status: CP
Start: 2022-05-07 — End: ?
  Administered 2022-05-07 (×3): 700 mg via INTRAVENOUS

## 2022-05-07 MED ORDER — DIPHENHYDRAMINE HCL 25 MG PO CAP
25 mg | Freq: Once | ORAL | 0 refills | Status: CP
Start: 2022-05-07 — End: ?
  Administered 2022-05-07: 16:00:00 25 mg via ORAL

## 2022-05-07 MED ORDER — ACETAMINOPHEN 325 MG PO TAB
650 mg | Freq: Once | ORAL | 0 refills | Status: CP
Start: 2022-05-07 — End: ?
  Administered 2022-05-07: 16:00:00 650 mg via ORAL

## 2022-05-07 NOTE — Patient Instructions
Call Immediately to report the following:  Uncontrolled nausea and/or vomiting, uncontrolled pain, or unusual bleeding.  Temperature of 100.4 F or greater and/or any sign/symptom of infection (redness, warmth, tenderness)  Painful mouth or difficulty swallowing  Red, cracked, or painful hands and/or feet  Diarrhea   Swelling of arms or legs  Rash    Important Phone Numbers:  [facility] Main Number (answered 24 hours a day) [phone]  Cancer Center Scheduling (appointments) [phone] or [phone]  [facility] (for nutritional supplements) [phone]        Port Maintenance - If you have a port, it should be flushed every 6-8 weeks when not in use.  Please check with your MD, nurse, or the scheduler.

## 2022-05-07 NOTE — Progress Notes
Name: Crystal Cameron          MRN: [id number]      DOB: 03/04/1956      AGE: 66 y.o.   DATE OF SERVICE: 05/07/2022    Subjective:             Reason for Visit:  Heme/Onc Care      Crystal Cameron is a 66 y.o. female.      Cancer Staging   Splenic marginal zone b-cell lymphoma (HCC)  Staging form: Hodgkin And Non-Hodgkin Lymphoma, AJCC 8th Edition  - Clinical stage from 05/07/2022: Stage IV (Marginal zone lymphoma) - Signed by Lady Deutscher, MD on 05/07/2022      Crystal Cameron is here for follow-up of her marginal zone splenic lymphoma.  Her bone marrow was showed marginal lymphoma and the PET scan was otherwise normal except for the spleen    Crystal Cameron is here with her husband [name]  They had noticed when he put her arm across her abdomen that he thought he felt a mass.  They gone to see the doctor.  I CAT scan in July 1 showed the spleen of 620 x 19 cm.  The liver looked normal.  There is no adenopathy.  Lab work about that same time showed a hemoglobin 11.2, platelets of 99, and ANC of 2300 and lymphocytopenia and a white count of 2.8.  The patient has been trying to lose weight and maybe lost 10 pounds in the last 4 months.  She does have some sweats at night but that does not psych that is necessarily new.  She is not aware of any skin rashes.  After the diagnosis of marginal zone lymphoma from a bone marrow biopsy on June 11, 2021 the patient had a PET scan that was otherwise unrevealing.  After long discussion the patient began rituximab on July 16, 2021.  The patient then began maintenance rituximab.  A scan in late December 2022 showed improvement in her splenomegaly.    Crystal Cameron is here for her 6 out of planned 8 cycles of rituximab.  She is now on the maintenance portion.    The patient had some achy feeling after her last chemo 2 months ago.  She also worked out hard after she got home in the heat.  It may have been for that.  It was like a migraine they just were hung around for couple weeks.  Its been better.  We will see what happens again.    We talked about doing 2 more rounds than beginning surveillance.  We will probably do a scan maybe 4 months after her last dose for baseline assessment.  Earlier she had presented with cytopenias and we will of course be watching that.  We will probably see her every 3 to 4 months for the first year then perhaps every 6 months for the next total of 5 years then annually after that.    ==========================================    In addition to the history of splenic marginal zone lymphoma involving her bone marrow, the patient has a history of skin cancer and also generalized anxiety disorder.    The patient is originally from around [facility].  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    The patient's past medical, surgical, social, and family histories were reviewed with the patient at this visit. I  also reviewed the recent laboratory, pathology, and radiological studies.  The patient is at risk of complications from her disease as well as complication of summer therapy.  I think she will benefit from ongoing surveillance of her physical exam, lab work and radiologic studies.  ==========================================    The patient will return in 2 in 4 months for Rituxan.           Review of Systems   Constitutional: Negative for chills and fever.   HENT: Negative for nosebleeds.    Eyes: Negative for pain and visual disturbance.   Respiratory: Negative for cough and shortness of breath.    Cardiovascular: Negative for chest pain and leg swelling.   Gastrointestinal: Negative for abdominal pain.   Genitourinary: Negative for hematuria.   Musculoskeletal: Negative for joint swelling and neck pain.   Skin: Negative for rash.   Neurological: Negative for dizziness and weakness.   Hematological: Negative for adenopathy.   Psychiatric/Behavioral: Negative for confusion.         Objective:           ? acetaminophen (TYLENOL EXTRA STRENGTH) 500 mg tablet Take one tablet by mouth as Needed for Pain. Max of 4,000 mg of acetaminophen in 24 hours.   ? BIOTIN PO Take 6,000 mcg by mouth daily.   ? KRILL OIL PO Take 350 mg by mouth daily.   ? lutein 20 mg tab Take one tablet by mouth daily.   ? Multivitamins with Fluoride (MULTI-VITAMIN PO) Take  by mouth daily.   ? sertraline (ZOLOFT) 50 mg tablet Take one tablet by mouth daily.       Vitals:    05/07/22 1008   BP: (!) 145/81   BP Source: Arm, Left Upper   Pulse: 66   Temp: 36.8 ?C (98.2 ?F)   Resp: 18   SpO2: 100%   TempSrc: Oral   PainSc: Zero   Weight: 76.1 kg (167 lb 12.8 oz)     Body mass index is 29.92 kg/m?Marland Kitchen     Medical History:   Diagnosis Date   ? Cancer [facility])    ? Cancer of skin    ? Vision decreased      Surgical History:   Procedure Laterality Date   ? CESAREAN SECTION     ? HAND SURGERY      BONE GRAFT   ? HX APPENDECTOMY     ? TONSILLECTOMY        Social History     Tobacco Use   ? Smoking status: Never   ? Smokeless tobacco: Never   Vaping Use   ? Vaping Use: Never used   Substance Use Topics   ? Alcohol use: Never   ? Drug use: Never     Family History   Problem Relation Age of Onset   ? Cancer Mother 34   ? Arthritis-rheumatoid Mother 35   ? Arthritis-osteo Mother 12   ? Cancer-Prostate Father    ? Cancer Maternal Aunt 19   ? Cancer Maternal Grandmother 89         Pain Score: Zero            Pain Addressed:  N/A    Patient Evaluated for a Clinical Trial: No treatment clinical trial available for this patient.     [place] Cooperative Oncology Group performance status is 0, Fully active, able to carry on all pre-disease performance without restriction.Marland Kitchen     Physical Exam  Vitals reviewed.   Constitutional:  General: She is not in acute distress.     Appearance: Normal appearance. She is well-developed and normal weight. She is not ill-appearing.   HENT:      Head: Normocephalic and atraumatic.   Eyes: Conjunctiva/sclera: Conjunctivae normal.   Neck:      Vascular: No JVD.      Trachea: No tracheal deviation.   Cardiovascular:      Rate and Rhythm: Normal rate and regular rhythm.      Heart sounds:      No friction rub. No gallop.   Pulmonary:      Effort: Pulmonary effort is normal. No respiratory distress.      Breath sounds: Normal breath sounds. No stridor. No wheezing or rales.   Abdominal:      General: There is no distension.      Palpations: Abdomen is soft. There is no mass.      Tenderness: There is no abdominal tenderness.   Musculoskeletal:         General: Normal range of motion.      Cervical back: Normal range of motion. No rigidity.      Right lower leg: No edema.      Left lower leg: No edema.   Lymphadenopathy:      Head:      Right side of head: No submental or submandibular adenopathy.      Left side of head: No submental or submandibular adenopathy.      Cervical: No cervical adenopathy.      Right cervical: No superficial or posterior cervical adenopathy.     Left cervical: No superficial or posterior cervical adenopathy.      Upper Body:      Right upper body: No supraclavicular or epitrochlear adenopathy.      Left upper body: No supraclavicular or epitrochlear adenopathy.   Skin:     General: Skin is warm and dry.      Findings: No bruising, lesion or rash.   Neurological:      Mental Status: She is alert and oriented to person, place, and time.      Cranial Nerves: No cranial nerve deficit.      Motor: No weakness.      Gait: Gait normal.   Psychiatric:         Mood and Affect: Mood normal.         Behavior: Behavior normal.         Thought Content: Thought content normal.         Judgment: Judgment normal.               Assessment and Plan:  Patient Active Problem List    Diagnosis Date Noted   ? Splenic marginal zone b-cell lymphoma (HCC) 05/26/2021     Priority: Medium     05/14/2021 [facility] laboratory creatinine 1.1, transaminases alk phos total bili normal, TSH 1.1, W2.8 low, H11.2 Below 12, MCV 82.1, platelets 99 low, ANC 2300, lymphocytes 300 slightly low,  05/15/2021 [facility] in [place] CT abdomen pelvis liver is normal sign.  Spleen is massively enlarged measuring 20 cm x 19 cm.  No focal splenic lesions.  No adenopathy.  Diverticulosis throughout the sigmoid colon.  05/20/2021 [facility] medical clinic, [place]. [place].  66 year old female to review CAT scan that showed massive splenomegaly.  Lab work showed pancytopenia.  Patient says she has lost weight maybe 55 pounds since last fall.  Also  reports weight loss and night sweats.  05/20/2021 [facility] BCR/ABL 1 and 2 not detected  05/22/2021 osl showed W2.3, H10.5, MCV 82, platelets 129, MPV 11.2 slightly elevated, 78% neutrophils  05/29/2021 [facility] peripheral smear pancytopenia.  06/03/2021 this is patient's first visit to [facility] office.  Reviewed imaging showing enlarged spleen along with exam feeling palpable spleen.  Talked about peripheral smear not being helpful.  We will arrange for bone marrow biopsy with conscious sedation and follow-up about 3 weeks later.  She is aware that I am concerned about possible lymphoproliferative sorter or myeloproliferative disorder.  Her mother died from a T-cell lymphoma at an older age.  Her mother also had melanoma.  06/11/2021 [place] pathology bone marrow CD5 negative/CD10 negative small B-cell lymphoma involving approxi-5-10% of cellular elements in a hypercellular bone marrow (60%) with trilineage hematopoiesis and 1% blast.  See comment.  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 - - - - consider PET and treatment with BR  06/12/2021 called and talked with patient about results of bone marrow showing marginal zone lymphoma.  Mention plans for PET scan.  Consider Bendamustine rituximab given every 4 weeks x 6 cycles.  Talked about risk for infection due to marginal zone lymphoma and pancytopenia but also risk of infection with both rituximab lymphocyte depletion as well as potential neutropenia with Bendamustine.  We will also do more think about whether single agent rituximab versus BR versus B TK inhibitor would be appropriate.  We will call patient after PET scan and arrange for teaching of appropriate therapy.  Patient will ask questions.  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  06/25/2021 [place] PET scan spleen a megaly with diffuse FDG uptake maximal SUV of 6.6 measuring 21 cm previously 20 cm.  No hypermetabolic lymphadenopathy.  06/25/2021 tried reaching patient.  No answer.  We will check to see if she has had HCV testing.  We will also check HBV testing.  If HCV positive might consider antiretroviral therapy.  If negative would consider rituximab single agent.  06/30/2021 [facility] hepatitis C virus antibody 0.2, negative.  Hepatitis B surface antigen screen negative, hepatitis B core antibody screen negative  07/06/2021 orders entered for Bendamustine rituximab with plans for 6 cycles depending upon tolerance and counts.  07/08/2021 discussed case with Dr. Mikey Bussing.  Will change treatment plan to rituximab 375 mg/metered squared for 4-8 weekly doses depending upon tolerance and benefit and if tolerated probably 4 additional doses at 12-month intervals or some other form of maintenance for 2 years.  This was discussed with the patient.  Orders will be entered.  We will also place orders for vaccines for Streptococcus, Neisseria meningitidis and haemophilus influenza.  07/15/21  Rituxan education.  Start tomorrow.  Pre-splenectomy vaccinations today.  Start allopurinol today.  1 week tox check.  FU with Dr. Madison Hickman in about 4 weeks.    07/23/21  Week 2 Rituxan.  Marked improvement in palpable spleen size.  Lab improved.  Sx significantly improved.  Continue weekly Rituxan and lab.   FU with Dr. Madison Hickman in 3 weeks as scheduled  08/13/2021 here for week 5 of planned date of Rituxan to be then followed by additional doses may be up to 2 years.  Has a mouth sore that may be viral.  We will probably try her antiviral.  If that does not work there is a mouth paste we may  try.  She says belly began feeling better about 3 days after receiving therapy.  Energy improving.  Return to see [place] in 2 in 4 weeks.  Will maybe consider scan 2 months after that 1 more ready to start the next maintenance therapy.  09/11/2021 patient feeling much better.  Exam much improved.  Spleen at the edge of the ribs on inspiration.  We will plan on every 2 month Rituxan for the next 2 years.  We will do a scan before return in 2 months.  To call if questions.  11/20/21: Doing well; mostly recovered from covid/rebound covid. Here for C1 of maintenance rituxan. Scans reviewed. Dr. Madison Hickman in two months with next treamtnet.   SPEP M spike 0.13  10/23/2021 [place] CT AP: Interval decrease in size of now mild splenomegaly.  Unchanged left likely low-density area involving the superior lateral aspect of spleen.  No significant abdominal pelvic adenopathy or ascites.  Spleen now measures 13.4 x 6.9 x 13 cm compared to 16.1 x 8.5 x 20.1.  01/15/22  #2 maintenance Rituxan.  No s/s progressive lymphoma.  Spleen non palpable.  Stable lab.  FU with Dr. Madison Hickman in 2 mos w/ lab.    03/12/2022 patient here for maintenance 5 of planned 8 of rituximab every 2 months.  Tolerating well.  Energy improving.  No unexplained fevers chills nausea or vomiting.  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  She will see [place] in 2 months in 4 months for additional rituximab.  05/07/2022 cycle 6 of planned 8 of rituximab.  She is on the maintenance portion.  She had about 2 weeks of a achy headache type feeling but also was out in the sun working hard after her last infusion.  We will see if it happens again.  Otherwise doing well.  Here with her husband.  We will see her in 2 months.  We talk about possible scan about 4 months after her last dose and then surveillance after that.    PMH: Generalized anxiety disorder    SH: Patient and husband both from the same small town I believe at [facility].  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      NCCN guidelines version 4.2023 splenic marginal zone lymphoma  Surveillance  Clinical follow-up every 3-6 months for 5 years then yearly or as clinically indicated.                               ? Pancytopenia (HCC) 06/12/2021     Priority: Low     06/12/2021 likely related to bone marrow involvement by marginal zone lymphoma.     ? Zoster without complications 05/26/2021   ? Other specified anxiety disorders 05/26/2021      This note was created with partial dictation using a dragon dictation system, please notify us if you notice any errors of omissions or content.

## 2022-05-07 NOTE — Progress Notes
Day 1 Cycle 6 Rituxan      Patient to follow up with Dr. Jacqualyn Posey  OK to treat, labs OK to treat.  PIV started, brisk blood return.  Tolerated infusion well.   PIV DC'd intact.   Discharged in stable condition.         CHEMO NOTE  Verified chemo consent signed and in chart.    Blood return positive via: Peripheral (22 ga)    BSA and dose double checked (agree with orders as written) with: yes     Labs/applicable tests checked: CBC and Comprehensive Metabolic Panel (CMP)    Chemo regimen: Drug/cycle/day  C6 D1 Rituxan    Rate verified and armband double check with second RN: yes, see eMar    Patient education offered and stated understanding. Denies questions at this time.

## 2022-05-07 NOTE — Progress Notes
PATIENT GIVEN AVS 05/07/22 11:28 AM

## 2022-06-22 ENCOUNTER — Encounter: Admit: 2022-06-22 | Discharge: 2022-06-22 | Payer: MEDICARE

## 2022-07-01 ENCOUNTER — Encounter: Admit: 2022-07-01 | Discharge: 2022-07-01 | Payer: MEDICARE

## 2022-07-01 DIAGNOSIS — C8307 Small cell B-cell lymphoma, spleen: Secondary | ICD-10-CM

## 2022-07-01 MED ORDER — RITUXIMAB IVPB (SPEC VOL)
375 mg/m2 | Freq: Once | INTRAVENOUS | 0 refills
Start: 2022-07-01 — End: ?

## 2022-07-01 MED ORDER — ACETAMINOPHEN 325 MG PO TAB
650 mg | Freq: Once | ORAL | 0 refills
Start: 2022-07-01 — End: ?

## 2022-07-01 MED ORDER — DIPHENHYDRAMINE HCL 25 MG PO CAP
25 mg | Freq: Once | ORAL | 0 refills
Start: 2022-07-01 — End: ?

## 2022-07-02 ENCOUNTER — Encounter: Admit: 2022-07-02 | Discharge: 2022-07-02 | Payer: MEDICARE

## 2022-07-02 DIAGNOSIS — C8307 Small cell B-cell lymphoma, spleen: Secondary | ICD-10-CM

## 2022-07-02 DIAGNOSIS — H547 Unspecified visual loss: Secondary | ICD-10-CM

## 2022-07-02 DIAGNOSIS — C801 Malignant (primary) neoplasm, unspecified: Secondary | ICD-10-CM

## 2022-07-02 DIAGNOSIS — C4490 Unspecified malignant neoplasm of skin, unspecified: Secondary | ICD-10-CM

## 2022-07-02 LAB — COMPREHENSIVE METABOLIC PANEL
ALBUMIN: 4.4 g/dL — ABNORMAL LOW (ref 3.5–5.0)
ALT: 7 U/L (ref 7–56)
ANION GAP: 7 K/UL — ABNORMAL LOW (ref 3–12)
AST: 13 U/L (ref 7–40)
CALCIUM: 9.9 mg/dL (ref 8.5–10.6)
CREATININE: 0.7 mg/dL (ref 0.4–1.00)
EGFR: >60 mL/min (ref >60)
POTASSIUM: 4.4 MMOL/L (ref 3.5–5.1)
SODIUM: 141 MMOL/L (ref 137–147)
TOTAL PROTEIN: 7.3 g/dL (ref 6.0–8.0)

## 2022-07-02 LAB — CBC AND DIFF
ABSOLUTE BASO COUNT: 0 K/UL (ref 0–0.20)
ABSOLUTE EOS COUNT: 0.1 K/UL (ref 0–0.45)
BASOPHILS %: 1 % (ref 0–2)
MCH: 29 pg (ref 26–34)
MCV: 88 FL (ref 80–100)
MONOCYTES %: 6 % (ref 4–12)
NEUTROPHILS %: 75 % (ref 41–77)
RBC COUNT: 4.8 M/UL (ref 4.0–5.0)
WBC COUNT: 4.9 K/UL (ref 4.5–11.0)

## 2022-07-02 LAB — LDH-LACTATE DEHYDROGENASE: LDH: 196 U/L (ref 100–210)

## 2022-07-02 MED ORDER — ACETAMINOPHEN 325 MG PO TAB
650 mg | Freq: Once | ORAL | 0 refills | Status: CP
Start: 2022-07-02 — End: ?
  Administered 2022-07-02: 15:00:00 650 mg via ORAL

## 2022-07-02 MED ORDER — RITUXIMAB IVPB (SPEC VOL)
375 mg/m2 | Freq: Once | INTRAVENOUS | 0 refills | Status: CP
Start: 2022-07-02 — End: ?
  Administered 2022-07-02 (×3): 700 mg via INTRAVENOUS

## 2022-07-02 MED ORDER — DIPHENHYDRAMINE HCL 25 MG PO CAP
25 mg | Freq: Once | ORAL | 0 refills | Status: CP
Start: 2022-07-02 — End: ?
  Administered 2022-07-02: 15:00:00 25 mg via ORAL

## 2022-07-02 NOTE — Progress Notes
CHEMO NOTE  Verified chemo consent signed and in chart.    Blood return positive via: Peripheral (22 ga)    BSA and dose double checked (agree with orders as written) with: yes, see MAR.     Labs/applicable tests checked: None    Chemo regimen: Day 1, Cycle 7; Rapid Rituxan     Rate verified and armband double check with second RN: yes    Patient education offered and stated understanding. Denies questions at this time.    Pt arrived for treatment. PIV placed in L arm with good blood return. Pre-medication given then Rapid Rituxan titrated per treatment plan. Pt tolerated and denied any questions or concerns. PIV removed. Pt discharged ambulatory.

## 2022-07-02 NOTE — Progress Notes
Name: Crystal Cameron          MRN: [id number]      DOB: 1955/12/15      AGE: 66 y.o.   DATE OF SERVICE: 07/02/2022    Subjective:             Reason for Visit:  No chief complaint on file.      Crystal Cameron is a 66 y.o. female.      Cancer Staging   Splenic marginal zone b-cell lymphoma (HCC)  Staging form: Hodgkin And Non-Hodgkin Lymphoma, AJCC 8th Edition  - Clinical stage from 05/07/2022: Stage IV (Marginal zone lymphoma) - Signed by Lady Deutscher, MD on 05/07/2022      History of Present Illness  Crystal Cameron is here today in FU of her?marginal zone lymphoma.??  ?  She felt a mass in her abdomen. ?CAT scan?on?July 1 showed massive enlargement of?the spleen?to?20 x 19 cm. ?The liver looked normal. ?There?was no adenopathy. ?Lab work showed a hemoglobin 11.2, platelets of 99, ANC of 2300,?lymphocytopenia and a white count of 2.8. ?The patient has lost 10 pounds in the last 4 months. ?She?denies night sweats, rash, itching. ?  ?  PET on 06/25/21 showed marked splenomegaly with diffuse hypermetabolism compatible with lymphoma. ?No LAD. ?BM bx findings on 06/09/21 favored marginal zone lymphoma, 5-10% involvement. ?  ?  She reports abdominal fullness, early satiety and fatigue.??  ?  -- ?07/16/21 ?Start weekly Rituxan  --  11/20/21  Started maintenance Rituxan (Q2 mos x2 years)  ?  Interim ?Hx:  No new sx or concerns.    Denies B sx, enlarged LNs.         Review of Systems   Constitutional: Positive for fatigue. Negative for diaphoresis, fever and unexpected weight change.   Respiratory: Negative.    Cardiovascular: Negative.    Gastrointestinal: Negative.    Genitourinary: Negative.    Musculoskeletal: Negative.    Skin: Negative.    Neurological: Positive for headaches (mild and intermittent).   Hematological: Negative.  Negative for adenopathy.   Psychiatric/Behavioral: Negative.    All other systems reviewed and are negative.        Objective:         ? acetaminophen (TYLENOL EXTRA STRENGTH) 500 mg tablet Take one tablet by mouth as Needed for Pain. Max of 4,000 mg of acetaminophen in 24 hours.   ? BIOTIN PO Take 6,000 mcg by mouth daily.   ? KRILL OIL PO Take 350 mg by mouth daily.   ? lutein 20 mg tab Take one tablet by mouth daily.   ? Multivitamins with Fluoride (MULTI-VITAMIN PO) Take  by mouth daily.   ? sertraline (ZOLOFT) 50 mg tablet Take one tablet by mouth daily.     There were no vitals filed for this visit.  There is no height or weight on file to calculate BMI.                  Pain Addressed:  N/A    Patient Evaluated for a Clinical Trial: No treatment clinical trial available for this patient.     [place] Cooperative Oncology Group performance status is 0, Fully active, able to carry on all pre-disease performance without restriction.Marland Kitchen     Physical Exam  Vitals reviewed.   Constitutional:       Appearance: Normal appearance.   HENT:      Head: Normocephalic.   Eyes:      Extraocular Movements: Extraocular movements  intact.   Cardiovascular:      Rate and Rhythm: Normal rate.   Pulmonary:      Effort: Pulmonary effort is normal.      Breath sounds: Normal breath sounds.   Abdominal:      General: Abdomen is flat.      Palpations: Abdomen is soft. There is no mass.      Comments: Spleen is not palpable     Musculoskeletal:         General: Normal range of motion.      Cervical back: Normal range of motion.   Skin:     General: Skin is warm and dry.   Neurological:      General: No focal deficit present.      Mental Status: She is alert and oriented to person, place, and time.   Psychiatric:         Mood and Affect: Mood normal.         Behavior: Behavior normal.         Thought Content: Thought content normal.          Results for orders placed or performed during the hospital encounter of 07/02/22 (from the past 336 hour(s))   CBC AND DIFF   Result Value Ref Range    White Blood Cells 4.9 4.5 - 11.0 K/UL    RBC 4.86 4.0 - 5.0 M/UL    Hemoglobin 14.2 12.0 - 15.0 GM/DL    Hematocrit 16.1 36 - 45 %    MCV 88.4 80 - 100 FL    MCH 29.2 26 - 34 PG    MCHC 33.1 32.0 - 36.0 G/DL    RDW 09.6 11 - 15 %    Platelet Count 183 150 - 400 K/UL    MPV 9.1 7 - 11 FL    Neutrophils 75 41 - 77 %    Lymphocytes 16 (L) 24 - 44 %    Monocytes 6 4 - 12 %    Eosinophils 2 0 - 5 %    Basophils 1 0 - 2 %    Absolute Neutrophil Count 3.70 1.8 - 7.0 K/UL    Absolute Lymph Count 0.80 (L) 1.0 - 4.8 K/UL    Absolute Monocyte Count 0.30 0 - 0.80 K/UL    Absolute Eosinophil Count 0.10 0 - 0.45 K/UL    Absolute Basophil Count 0.00 0 - 0.20 K/UL          Assessment and Plan:       Patient Active Problem List    Diagnosis Date Noted   ? Pancytopenia (HCC) 06/12/2021     06/12/2021 likely related to bone marrow involvement by marginal zone lymphoma.     ? Splenic marginal zone b-cell lymphoma [facility]) 05/26/2021     05/14/2021 [facility] laboratory creatinine 1.1, transaminases alk phos total bili normal, TSH 1.1, W2.8 low, H11.2 Below 12, MCV 82.1, platelets 99 low, ANC 2300, lymphocytes 300 slightly low,  05/15/2021 [facility] in [place] CT abdomen pelvis liver is normal sign.  Spleen is massively enlarged measuring 20 cm x 19 cm.  No focal splenic lesions.  No adenopathy.  Diverticulosis throughout the sigmoid colon.  05/20/2021 [facility] medical clinic, [place].  66 year old female to review CAT scan that showed massive splenomegaly.  Lab work showed pancytopenia.  Patient says she has lost weight maybe 55 pounds since last fall.  Also reports weight loss and night sweats.  05/20/2021 [facility] BCR/ABL 1 and 2 not detected  05/22/2021 osl showed W2.3, H10.5, MCV 82, platelets 129, MPV 11.2 slightly elevated, 78% neutrophils  05/29/2021 [facility] peripheral smear pancytopenia.  06/03/2021 this is patient's first visit to [facility] office.  Reviewed imaging showing enlarged spleen along with exam feeling palpable spleen.  Talked about peripheral smear not being helpful.  We will arrange for bone marrow biopsy with conscious sedation and follow-up about 3 weeks later.  She is aware that I am concerned about possible lymphoproliferative sorter or myeloproliferative disorder.  Her mother died from a T-cell lymphoma at an older age.  Her mother also had melanoma.  06/11/2021 [place] [facility] bone marrow CD5 negative/CD10 negative small B-cell lymphoma involving approxi-5-10% of cellular elements in a hypercellular bone marrow (60%) with trilineage hematopoiesis and 1% blast.  See comment.  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 - - - - consider PET and treatment with BR  06/12/2021 called and talked with patient about results of bone marrow showing marginal zone lymphoma.  Mention plans for PET scan.  Consider Bendamustine rituximab given every 4 weeks x 6 cycles.  Talked about risk for infection due to marginal zone lymphoma and pancytopenia but also risk of infection with both rituximab lymphocyte depletion as well as potential neutropenia with Bendamustine.  We will also do more think about whether single agent rituximab versus BR versus B TK inhibitor would be appropriate.  We will call patient after PET scan and arrange for teaching of appropriate therapy.  Patient will ask questions.  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  06/25/2021  PET scan spleen a megaly with diffuse FDG uptake maximal SUV of 6.6 measuring 21 cm previously 20 cm.  No hypermetabolic lymphadenopathy.  06/25/2021 tried reaching patient.  No answer.  We will check to see if she has had HCV testing.  We will also check HBV testing.  If HCV positive might consider antiretroviral therapy.  If negative would consider rituximab single agent.  06/30/2021 [facility] hepatitis C virus antibody 0.2, negative.  Hepatitis B surface antigen screen negative, hepatitis B core antibody screen negative  07/06/2021 orders entered for Bendamustine rituximab with plans for 6 cycles depending upon tolerance and counts.  07/08/2021 discussed case with Dr. Mikey Bussing.  Will change treatment plan to rituximab 375 mg/metered squared for 4-8 weekly doses depending upon tolerance and benefit and if tolerated probably 4 additional doses at 65-month intervals or some other form of maintenance for 2 years.  This was discussed with the patient.  Orders will be entered.  We will also place orders for vaccines for Streptococcus, Neisseria meningitidis and haemophilus influenza.  07/15/21  Rituxan education.  Start tomorrow.  Pre-splenectomy vaccinations today.  Start allopurinol today.  1 week tox check.  FU with Dr. Madison Hickman in about 4 weeks.    07/23/21  Week 2 Rituxan.  Marked improvement in palpable spleen size.  Lab improved.  Sx significantly improved.  Continue weekly Rituxan and lab.   FU with Dr. Madison Hickman in 3 weeks as scheduled  08/13/2021 here for week 5 of planned date of Rituxan to be then followed by additional doses may be up to 2 years.  Has a mouth sore that may be viral.  We will probably try her antiviral.  If that does not work there is a mouth paste we may try.  She says belly began feeling  better about 3 days after receiving therapy.  Energy improving.  Return to see [place] in 2 in 4 weeks.  Will maybe consider scan 2 months after that 1 more ready to start the next maintenance therapy.  09/11/2021 patient feeling much better.  Exam much improved.  Spleen at the edge of the ribs on inspiration.  We will plan on every 2 month Rituxan for the next 2 years.  We will do a scan before return in 2 months.  To call if questions.  11/20/21: Doing well; mostly recovered from covid/rebound covid. Here for C1 of maintenance rituxan. Scans reviewed. Dr. Madison Hickman in two months with next treamtnet.   SPEP M spike 0.13  10/23/2021 [place] CT AP: Interval decrease in size of now mild splenomegaly.  Unchanged left likely low-density area involving the superior lateral aspect of spleen.  No significant abdominal pelvic adenopathy or ascites.  Spleen now measures 13.4 x 6.9 x 13 cm compared to 16.1 x 8.5 x 20.1.  01/15/22  #2 maintenance Rituxan.  No s/s progressive lymphoma.  Spleen non palpable.  Stable lab.  FU with Dr. Madison Hickman in 2 mos w/ lab.    03/12/2022 patient here for maintenance 5 of planned 8 of rituximab every 2 months.  Tolerating well.  Energy improving.  No unexplained fevers chills nausea or vomiting.  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  She will see [place] in 2 months in 4 months for additional rituximab.  05/07/2022 cycle 6 of planned 8 of rituximab.  She is on the maintenance portion.  She had about 2 weeks of a achy headache type feeling but also was out in the sun working hard after her last infusion.  We will see if it happens again.  Otherwise doing well.  Here with her husband.  We will see her in 2 months.  We talk about possible scan about 4 months after her last dose and then surveillance after that.  07/02/22  C7 maintenance rituxan.  No new sx or concerns to suggest recurrent lymphoma.  She will return to see Dr. Madison Hickman in 2 mos for last Rituxan infusion.   Imaging after that.  We discussed the COVID and influenza vaccinations and I suggested she get both when new COVID vaccine is released.      PMH: Generalized anxiety disorder    SH: Patient and husband both from the same small town I believe at [facility].  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      NCCN guidelines version 4.2023 splenic marginal zone lymphoma  Surveillance  Clinical follow-up every 3-6 months for 5 years then yearly or as clinically indicated.

## 2022-08-02 ENCOUNTER — Encounter: Admit: 2022-08-02 | Discharge: 2022-08-02 | Payer: MEDICARE

## 2022-08-02 DIAGNOSIS — C8307 Small cell B-cell lymphoma, spleen: Secondary | ICD-10-CM

## 2022-08-03 ENCOUNTER — Encounter: Admit: 2022-08-03 | Discharge: 2022-08-03 | Payer: MEDICARE

## 2022-08-30 ENCOUNTER — Encounter: Admit: 2022-08-30 | Discharge: 2022-08-30 | Payer: MEDICARE

## 2022-08-30 DIAGNOSIS — C8307 Small cell B-cell lymphoma, spleen: Secondary | ICD-10-CM

## 2022-08-30 MED ORDER — RITUXIMAB IVPB (SPEC VOL)
375 mg/m2 | Freq: Once | INTRAVENOUS | 0 refills
Start: 2022-08-30 — End: ?

## 2022-08-30 MED ORDER — DIPHENHYDRAMINE HCL 25 MG PO CAP
25 mg | Freq: Once | ORAL | 0 refills
Start: 2022-08-30 — End: ?

## 2022-08-30 MED ORDER — ACETAMINOPHEN 325 MG PO TAB
650 mg | Freq: Once | ORAL | 0 refills
Start: 2022-08-30 — End: ?

## 2022-08-31 ENCOUNTER — Encounter: Admit: 2022-08-31 | Discharge: 2022-08-31 | Payer: MEDICARE

## 2022-08-31 DIAGNOSIS — C8307 Small cell B-cell lymphoma, spleen: Secondary | ICD-10-CM

## 2022-08-31 DIAGNOSIS — C801 Malignant (primary) neoplasm, unspecified: Secondary | ICD-10-CM

## 2022-08-31 DIAGNOSIS — H547 Unspecified visual loss: Secondary | ICD-10-CM

## 2022-08-31 DIAGNOSIS — C4490 Unspecified malignant neoplasm of skin, unspecified: Secondary | ICD-10-CM

## 2022-08-31 LAB — COMPREHENSIVE METABOLIC PANEL
ALBUMIN: 4.3 g/dL — ABNORMAL LOW (ref 3.5–5.0)
ALK PHOSPHATASE: 77 U/L (ref 25–110)
ALT: 9 U/L (ref 7–56)
AST: 16 U/L (ref 7–40)
BLD UREA NITROGEN: 14 mg/dL — ABNORMAL HIGH (ref 7–25)
CALCIUM: 10 mg/dL (ref 8.5–10.6)
CHLORIDE: 103 MMOL/L (ref 98–110)
CO2: 32 MMOL/L — ABNORMAL HIGH (ref 21–30)
CREATININE: 0.8 mg/dL (ref 0.4–1.00)
POTASSIUM: 4 MMOL/L (ref 3.5–5.1)
TOTAL BILIRUBIN: 0.5 mg/dL (ref 0.3–1.2)
TOTAL PROTEIN: 7.2 g/dL (ref 6.0–8.0)

## 2022-08-31 LAB — CBC AND DIFF
ABSOLUTE BASO COUNT: 0 K/UL (ref 0–0.20)
ABSOLUTE EOS COUNT: 0.1 K/UL (ref 0–0.45)
ABSOLUTE LYMPH COUNT: 0.8 K/UL — ABNORMAL LOW (ref 1.0–4.8)
ABSOLUTE MONO COUNT: 0.3 K/UL (ref >60)
HEMOGLOBIN: 13 g/dL (ref 12.0–15.0)
MCH: 29 pg (ref 26–34)
RBC COUNT: 4.7 M/UL (ref 4.0–5.0)
WBC COUNT: 4.4 K/UL — ABNORMAL LOW (ref 4.5–11.0)

## 2022-08-31 MED ORDER — ACETAMINOPHEN 325 MG PO TAB
650 mg | Freq: Once | ORAL | 0 refills | Status: CP
Start: 2022-08-31 — End: ?
  Administered 2022-08-31: 16:00:00 650 mg via ORAL

## 2022-08-31 MED ORDER — RITUXIMAB IVPB (SPEC VOL)
375 mg/m2 | Freq: Once | INTRAVENOUS | 0 refills | Status: CP
Start: 2022-08-31 — End: ?
  Administered 2022-08-31 (×3): 700 mg via INTRAVENOUS

## 2022-08-31 MED ORDER — DIPHENHYDRAMINE HCL 25 MG PO CAP
25 mg | Freq: Once | ORAL | 0 refills | Status: CP
Start: 2022-08-31 — End: ?
  Administered 2022-08-31: 16:00:00 25 mg via ORAL

## 2022-08-31 NOTE — Progress Notes
Day 1 Cycle 8 Rituxan      Patient to follow up with Dr. Jacqualyn Posey  OK to treat, labs OK to treat.  PIV started, brisk blood return.  Tolerated infusion well.   PIV DC'd intact.   Discharged in stable condition.         CHEMO NOTE  Verified chemo consent signed and in chart.    Blood return positive via: Peripheral (24 ga)    BSA and dose double checked (agree with orders as written) with: yes     Labs/applicable tests checked: CBC and Comprehensive Metabolic Panel (CMP)    Chemo regimen: Drug/cycle/day  C8 D1 Rituxan    Rate verified and armband double check with second RN: yes, see eMar    Patient education offered and stated understanding. Denies questions at this time.

## 2022-08-31 NOTE — Patient Instructions
Call Immediately to report the following:  Uncontrolled nausea and/or vomiting, uncontrolled pain, or unusual bleeding.  Temperature of 100.4 F or greater and/or any sign/symptom of infection (redness, warmth, tenderness)  Painful mouth or difficulty swallowing  Red, cracked, or painful hands and/or feet  Diarrhea   Swelling of arms or legs  Rash    Important Phone Numbers:  [facility] Main Number (answered 24 hours a day) [phone]  Cancer Center Scheduling (appointments) [phone] or [phone]  [facility] (for nutritional supplements) [phone]        Port Maintenance - If you have a port, it should be flushed every 6-8 weeks when not in use.  Please check with your MD, nurse, or the scheduler.

## 2022-08-31 NOTE — Progress Notes
Name: Crystal Cameron          MRN: [id number]      DOB: 07-Dec-1955      AGE: 66 y.o.   DATE OF SERVICE: 08/31/2022    Subjective:             Reason for Visit:  Heme/Onc Care   Cancer Staging   Splenic marginal zone b-cell lymphoma [facility])  Staging form: Hodgkin And Non-Hodgkin Lymphoma, AJCC 8th Edition  - Clinical stage from 05/07/2022: Stage IV (Marginal zone lymphoma) - Signed by Lady Deutscher, MD on 05/07/2022        Crystal Cameron is a 66 y.o. female [place] presents in follow up of her stage 4 splenic marginal zone lymphoma.    History of present illness:    #---- Al is here for follow-up of her marginal zone splenic lymphoma.  Her bone marrow was showed marginal lymphoma and the PET scan was otherwise normal except for the spleen  ?  #---- Crystal Cameron had noticed when he put her arm across her abdomen that he thought he felt a mass.  They gone to see the doctor.  I CAT scan in July 1 showed the spleen of 620 x 19 cm.  The liver looked normal. There is no adenopathy.  Lab work about that same time showed a hemoglobin 11.2, platelets of 99, and ANC of 2300 and lymphocytopenia and a white count of 2.8.  The patient has been trying to lose weight and maybe lost 10 pounds in the last 4 months.  She does have some sweats at night but that does not psych that is necessarily new.  She is not aware of any skin rashes.  After the diagnosis of marginal zone lymphoma from a bone marrow biopsy on June 11, 2021 the patient had a PET scan that was otherwise unrevealing.  After long discussion the patient began rituximab on July 16, 2021. The patient then began maintenance rituximab.  A scan in late December 2022 showed improvement in her splenomegaly.  ?  ==========================================  ?  In addition to the history of splenic marginal zone lymphoma involving her bone marrow, the patient has a history of skin cancer and also generalized anxiety disorder.  ?  The patient is originally from around [facility].  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  ?  The patient's past medical, surgical, social, and family histories were reviewed with the patient at this visit. I also reviewed the recent laboratory, pathology, and radiological studies. The patient is at risk of complications from her disease as well as complication of summer therapy.  I think she will benefit from ongoing surveillance of her physical exam, lab work and radiologic studies.    ==========================================    08/31/2022:    She reports that overall, she is doing well. She is accompanied today by husband. She denies any recent hospitalizations. Today is her last cycle of Rituxan.     She received a COVID-19 booster vaccine 1 week ago and she is wondering when she should get another booster. She also mentions she received her flu and RSV vaccine.     She denies any new family medical history    They state that their dog was diagnosed with American canine hepatozoonosis which causes anemia. He was also diagnosed with a fungal disease called Histoplasma.  Their dog is 1.64 years old and is undergoing treatment. The veterinarian states that it could be bat born. They found a vet in [place] that specializes in these disorders.     They are not planning on traveling for Thanksgiving.     We reviewed her lab work, and her WBC count is 4.4, hemoglobin is 13.7, platelets are 190, ANC is 3.3, and ALC is 0.8. Her LGH from 06/2022 was normal at 196 and her IgG level was 1,024. Her CMP from today is still pending at the time of dictation but her results from 06/2022 were completely normal.     Her CT scan of the abdomen and pelvis from 10/2021 showed there was a decrease in her splenomegaly. It was a decrease from 16 cm to 13 cm.     Plan:    Regarding splenic marginal zone lymphoma, last dose of maintenance rituximab today. We will see her back in about 2 or 3 months with a CAT scan. We will also check the same labs, CBC, chemistry, IgG, and LDH.  We will likely see her every 4 to 6 months for the next 5 years.  Then yearly.         Review of Systems   Constitutional: Negative for chills and fever.   HENT: Negative for nosebleeds.    Eyes: Negative for pain and visual disturbance.   Respiratory: Negative for cough and shortness of breath.    Cardiovascular: Negative for chest pain and leg swelling.   Gastrointestinal: Negative for abdominal pain.   Genitourinary: Negative for hematuria.   Musculoskeletal: Negative for joint swelling and neck pain.   Skin: Negative for rash.   Neurological: Negative for dizziness and weakness.   Hematological: Negative for adenopathy.   Psychiatric/Behavioral: Negative for confusion.         Objective:           ? acetaminophen (TYLENOL EXTRA STRENGTH) 500 mg tablet Take one tablet by mouth as Needed for Pain. Max of 4,000 mg of acetaminophen in 24 hours.   ? BIOTIN PO Take 6,000 mcg by mouth daily.   ? CALCIUM PO Take 1,200 mg by mouth daily.   ? ergocalciferol (vitamin D2) (VITAMIN D PO) Take 800 mg by mouth daily.   ? KRILL OIL PO Take 350 mg by mouth daily.   ? lutein 20 mg tab Take one tablet by mouth daily.   ? Multivitamins with Fluoride (MULTI-VITAMIN PO) Take  by mouth daily.   ? sertraline (ZOLOFT) 50 mg tablet Take one tablet by mouth daily.       Vitals:    08/31/22 1034   BP: (!) 155/74   BP Source: Arm, Left Upper   Pulse: 70   Temp: 37.2 ?C (98.9 ?F)   Resp: 18   SpO2: 99%   O2 Device: None (Room air)   TempSrc: Oral   PainSc: Zero   Weight: 79 kg (174 lb 3.2 oz)     Body mass index is 31.06 kg/m?Marland Kitchen     Medical History:   Diagnosis Date   ? Cancer [facility])    ? Cancer of skin    ? Vision decreased      Surgical History:   Procedure Laterality Date   ? CESAREAN SECTION     ? HAND SURGERY      BONE GRAFT   ? HX APPENDECTOMY     ? TONSILLECTOMY        Social History  Tobacco Use   ? Smoking status: Never   ? Smokeless tobacco: Never   Vaping Use   ? Vaping Use: Never used   Substance Use Topics   ? Alcohol use: Never   ? Drug use: Never     Family History   Problem Relation Age of Onset   ? Cancer Mother 14   ? Arthritis-rheumatoid Mother 36   ? Arthritis-osteo Mother 110   ? Cancer-Prostate Father    ? Cancer Maternal Aunt 19   ? Cancer Maternal Grandmother 89         Pain Score: Zero       Fatigue Scale: 0-None    Pain Addressed:  N/A    Patient Evaluated for a Clinical Trial: No treatment clinical trial available for this patient.     [place] Cooperative Oncology Group performance status is 0, Fully active, able to carry on all pre-disease performance without restriction.Marland Kitchen     Physical Exam  Vitals reviewed.   Constitutional:       General: She is not in acute distress.     Appearance: Normal appearance. She is well-developed and normal weight. She is not ill-appearing.   HENT:      Head: Normocephalic and atraumatic.   Eyes:      Conjunctiva/sclera: Conjunctivae normal.   Neck:      Vascular: No JVD.      Trachea: No tracheal deviation.   Cardiovascular:      Rate and Rhythm: Normal rate and regular rhythm.      Heart sounds:      No friction rub. No gallop.   Pulmonary:      Effort: Pulmonary effort is normal. No respiratory distress.      Breath sounds: Normal breath sounds. No stridor. No wheezing or rales.   Abdominal:      General: There is no distension.      Palpations: Abdomen is soft. There is no mass.      Tenderness: There is no abdominal tenderness.   Musculoskeletal:         General: Normal range of motion.      Cervical back: Normal range of motion. No rigidity.      Right lower leg: No edema.      Left lower leg: No edema.   Lymphadenopathy:      Head:      Right side of head: No submental or submandibular adenopathy.      Left side of head: No submental or submandibular adenopathy.      Cervical: No cervical adenopathy.      Right cervical: No superficial or posterior cervical adenopathy. Left cervical: No superficial or posterior cervical adenopathy.      Upper Body:      Right upper body: No supraclavicular or epitrochlear adenopathy.      Left upper body: No supraclavicular or epitrochlear adenopathy.   Skin:     General: Skin is warm and dry.      Findings: No bruising, lesion or rash.   Neurological:      Mental Status: She is alert and oriented to person, place, and time.      Cranial Nerves: No cranial nerve deficit.      Motor: No weakness.      Gait: Gait normal.   Psychiatric:         Mood and Affect: Mood normal.         Behavior: Behavior normal.  Thought Content: Thought content normal.         Judgment: Judgment normal.               Assessment and Plan:  Patient Active Problem List    Diagnosis Date Noted   ? Pancytopenia (HCC) 06/12/2021     06/12/2021 likely related to bone marrow involvement by marginal zone lymphoma.     ? Splenic marginal zone b-cell lymphoma [facility]) 05/26/2021     05/14/2021 [facility] laboratory creatinine 1.1, transaminases alk phos total bili normal, TSH 1.1, W2.8 low, H11.2 Below 12, MCV 82.1, platelets 99 low, ANC 2300, lymphocytes 300 slightly low,  05/15/2021 [facility] in [place] CT abdomen pelvis liver is normal sign.  Spleen is massively enlarged measuring 20 cm x 19 cm.  No focal splenic lesions.  No adenopathy.  Diverticulosis throughout the sigmoid colon.  05/20/2021 [facility] medical clinic, [place] [facility] [place].  66 year old female to review CAT scan that showed massive splenomegaly.  Lab work showed pancytopenia.  Patient says she has lost weight maybe 55 pounds since last fall.  Also reports weight loss and night sweats.  05/20/2021 [facility] BCR/ABL 1 and 2 not detected  05/22/2021 osl showed W2.3, H10.5, MCV 82, platelets 129, MPV 11.2 slightly elevated, 78% neutrophils  05/29/2021 [facility] peripheral smear pancytopenia.  06/03/2021 this is patient's first visit to [facility] office.  Reviewed imaging showing enlarged spleen along with exam feeling palpable spleen.  Talked about peripheral smear not being helpful.  We will arrange for bone marrow biopsy with conscious sedation and follow-up about 3 weeks later.  She is aware that I am concerned about possible lymphoproliferative sorter or myeloproliferative disorder.  Her mother died from a T-cell lymphoma at an older age.  Her mother also had melanoma.  06/11/2021 [place] pathology bone marrow CD5 negative/CD10 negative small B-cell lymphoma involving approxi-5-10% of cellular elements in a hypercellular bone marrow (60%) with trilineage hematopoiesis and 1% blast.  See comment.  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 - - - - consider PET and treatment with BR  06/12/2021 called and talked with patient about results of bone marrow showing marginal zone lymphoma.  Mention plans for PET scan.  Consider Bendamustine rituximab given every 4 weeks x 6 cycles.  Talked about risk for infection due to marginal zone lymphoma and pancytopenia but also risk of infection with both rituximab lymphocyte depletion as well as potential neutropenia with Bendamustine.  We will also do more think about whether single agent rituximab versus BR versus B TK inhibitor would be appropriate.  We will call patient after PET scan and arrange for teaching of appropriate therapy.  Patient will ask questions.  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  06/25/2021 [place] PET scan spleen a megaly with diffuse FDG uptake maximal SUV of 6.6 measuring 21 cm previously 20 cm.  No hypermetabolic lymphadenopathy.  06/25/2021 tried reaching patient.  No answer.  We will check to see if she has had HCV testing.  We will also check HBV testing.  If HCV positive might consider antiretroviral therapy.  If negative would consider rituximab single agent.  06/30/2021 [facility] hepatitis C virus antibody 0.2, negative.  Hepatitis B surface antigen screen negative, hepatitis B core antibody screen negative  07/06/2021 orders entered for Bendamustine rituximab with plans for 6 cycles depending upon tolerance and counts.  07/08/2021 discussed case with Dr. Mikey Bussing.  Will change treatment plan to rituximab 375 mg/metered squared for 4-8 weekly doses depending upon tolerance and benefit and if tolerated probably 4 additional doses at 48-month intervals or some other form of maintenance for 2 years.  This was discussed with the patient.  Orders will be entered.  We will also place orders for vaccines for Streptococcus, Neisseria meningitidis and haemophilus influenza.  07/15/21  Rituxan education.  Start tomorrow.  Pre-splenectomy vaccinations today.  Start allopurinol today.  1 week tox check.  FU with Dr. Madison Hickman in about 4 weeks.    07/23/21  Week 2 Rituxan.  Marked improvement in palpable spleen size.  Lab improved.  Sx significantly improved.  Continue weekly Rituxan and lab.   FU with Dr. Madison Hickman in 3 weeks as scheduled  08/13/2021 here for week 5 of planned date of Rituxan to be then followed by additional doses may be up to 2 years.  Has a mouth sore that may be viral.  We will probably try her antiviral.  If that does not work there is a mouth paste we may try.  She says belly began feeling better about 3 days after receiving therapy.  Energy improving.  Return to see [place] in 2 in 4 weeks.  Will maybe consider scan 2 months after that 1 more ready to start the next maintenance therapy.  09/11/2021 patient feeling much better.  Exam much improved.  Spleen at the edge of the ribs on inspiration.  We will plan on every 2 month Rituxan for the next 2 years.  We will do a scan before return in 2 months.  To call if questions.  11/20/21: Doing well; mostly recovered from covid/rebound covid. Here for C1 of maintenance rituxan. Scans reviewed. Dr. Madison Hickman in two months with next treamtnet. SPEP M spike 0.13  10/23/2021 [place] CT AP: Interval decrease in size of now mild splenomegaly.  Unchanged left likely low-density area involving the superior lateral aspect of spleen.  No significant abdominal pelvic adenopathy or ascites.  Spleen now measures 13.4 x 6.9 x 13 cm compared to 16.1 x 8.5 x 20.1.  01/15/22  #2 maintenance Rituxan.  No s/s progressive lymphoma.  Spleen non palpable.  Stable lab.  FU with Dr. Madison Hickman in 2 mos w/ lab.    03/12/2022 patient here for maintenance 5 of planned 8 of rituximab every 2 months.  Tolerating well.  Energy improving.  No unexplained fevers chills nausea or vomiting.  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  She will see [place] in 2 months in 4 months for additional rituximab.  05/07/2022 cycle 6 of planned 8 of rituximab.  She is on the maintenance portion.  She had about 2 weeks of a achy headache type feeling but also was out in the sun working hard after her last infusion.  We will see if it happens again.  Otherwise doing well.  Here with her husband.  We will see her in 2 months.  We talk about possible scan about 4 months after her last dose and then surveillance after that.  07/02/22  C7 maintenance rituxan.  No new sx or concerns to suggest recurrent lymphoma.  She will return to see Dr. Madison Hickman in 2 mos for last Rituxan infusion.   Imaging after that.  We discussed the COVID and influenza vaccinations and I suggested she  get both when new COVID vaccine is released.      PMH: Generalized anxiety disorder    SH: Patient and husband both from the same small town I believe at [facility].  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      NCCN guidelines version 4.2023 splenic marginal zone lymphoma  Surveillance  Clinical follow-up every 3-6 months for 5 years then yearly or as clinically indicated.                               ? Zoster without complications 05/26/2021   ? Other specified anxiety disorders 05/26/2021               Attestation:  This visit was documented by Josefine Class via audio recorded by Glean Salvo, MD on August 31, 2022, at 02:00 PM.

## 2022-09-01 ENCOUNTER — Encounter: Admit: 2022-09-01 | Discharge: 2022-09-01 | Payer: MEDICARE

## 2022-09-01 DIAGNOSIS — C8307 Small cell B-cell lymphoma, spleen: Secondary | ICD-10-CM

## 2022-09-01 DIAGNOSIS — R7402 Elevated LDH: Secondary | ICD-10-CM

## 2022-09-01 NOTE — Telephone Encounter
Orders received to recheck LDH on one month. Call placed to patient to review. She reports she would like to have drawn at [facility] if possible- they have a walk in clinic. I let her know I'd send the orders over and contact her if they are unable to perform. She reports understanding.

## 2022-09-01 NOTE — Telephone Encounter
Patient called to review LDH results. Will have MD review and return call.

## 2022-10-27 ENCOUNTER — Encounter: Admit: 2022-10-27 | Discharge: 2022-10-27 | Payer: MEDICARE

## 2022-10-27 DIAGNOSIS — C8307 Small cell B-cell lymphoma, spleen: Secondary | ICD-10-CM

## 2022-10-27 DIAGNOSIS — R7402 Elevated LDH: Secondary | ICD-10-CM

## 2022-12-01 ENCOUNTER — Encounter: Admit: 2022-12-01 | Discharge: 2022-12-01 | Payer: MEDICARE

## 2022-12-01 DIAGNOSIS — C8307 Small cell B-cell lymphoma, spleen: Secondary | ICD-10-CM

## 2022-12-01 LAB — CBC AND DIFF
ABSOLUTE BASO COUNT: 0 K/UL (ref 0–0.20)
ABSOLUTE EOS COUNT: 0.1 K/UL (ref 0–0.45)
ABSOLUTE LYMPH COUNT: 1.1 K/UL (ref 1.0–4.8)
ABSOLUTE MONO COUNT: 0.4 K/UL (ref 0–0.80)
ABSOLUTE NEUTROPHIL: 4.7 K/UL (ref 1.8–7.0)
BASOPHILS %: 1 % (ref 0–2)
EOSINOPHILS %: 1 % (ref >60)
HEMATOCRIT: 41 % (ref 36–45)
HEMOGLOBIN: 13 g/dL (ref 12.0–15.0)
LYMPHOCYTES %: 18 % — ABNORMAL LOW (ref 24–44)
MCH: 28 pg (ref 26–34)
MCHC: 33 g/dL (ref 32.0–36.0)
MCV: 87 FL (ref 80–100)
MONOCYTES %: 6 % (ref 4–12)
MPV: 9.5 FL (ref 7–11)
NEUTROPHILS %: 74 % (ref 41–77)
PLATELET COUNT: 235 K/UL (ref 150–400)
RBC COUNT: 4.7 M/UL (ref 4.0–5.0)
RDW: 13 % (ref 11–15)
WBC COUNT: 6.3 K/UL (ref 4.5–11.0)

## 2022-12-01 LAB — IMMUNOGLOBULIN G (IGG): IGG: 115 mg/dL (ref 762–1488)

## 2022-12-01 LAB — COMPREHENSIVE METABOLIC PANEL
POTASSIUM: 3.9 MMOL/L (ref 3.5–5.1)
SODIUM: 142 MMOL/L (ref 137–147)

## 2022-12-01 LAB — POC CREATININE, RAD: CREATININE, POC: 0.8 mg/dL (ref >60)

## 2022-12-01 LAB — LDH-LACTATE DEHYDROGENASE: LDH: 180 U/L (ref 100–210)

## 2022-12-01 MED ORDER — IOHEXOL 350 MG IODINE/ML IV SOLN
100 mL | Freq: Once | INTRAVENOUS | 0 refills | Status: CP
Start: 2022-12-01 — End: ?
  Administered 2022-12-01: 18:00:00 100 mL via INTRAVENOUS

## 2022-12-01 MED ORDER — SODIUM CHLORIDE 0.9 % IJ SOLN
50 mL | Freq: Once | INTRAVENOUS | 0 refills | Status: CP
Start: 2022-12-01 — End: ?
  Administered 2022-12-01: 18:00:00 50 mL via INTRAVENOUS

## 2022-12-02 ENCOUNTER — Encounter: Admit: 2022-12-02 | Discharge: 2022-12-02 | Payer: MEDICARE

## 2022-12-02 DIAGNOSIS — C4490 Unspecified malignant neoplasm of skin, unspecified: Secondary | ICD-10-CM

## 2022-12-02 DIAGNOSIS — H547 Unspecified visual loss: Secondary | ICD-10-CM

## 2022-12-02 DIAGNOSIS — C801 Malignant (primary) neoplasm, unspecified: Secondary | ICD-10-CM

## 2022-12-02 DIAGNOSIS — C8307 Small cell B-cell lymphoma, spleen: Secondary | ICD-10-CM

## 2022-12-02 NOTE — Progress Notes
Name: Crystal Cameron          MRN: [id number]      DOB: 12/20/1955      AGE: 67 y.o.   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 female.     History of present illness:    #---- Crystal Cameron is here for follow-up of her marginal zone splenic lymphoma.  Her bone marrow in July, 2022,  showed marginal lymphoma and the PET scan was otherwise normal except for the spleen     #---- Crystal Cameron had noticed when she put her arm across her abdomen that she thought she felt a mass.  They went to see the doctor.  A CAT scan in July 1 showed the spleen of 20 x 19 cm.  The liver looked normal. There is no adenopathy.  Lab work about that same time showed a hemoglobin 11.2, platelets of 99, and ANC of 2300 and lymphocytopenia and a white count of 2.8.  The patient has been trying to lose weight and maybe lost 10 pounds in the last 4 months.  She does have some sweats at night but that does not psych that is necessarily new.  She is not aware of any skin rashes.  After the diagnosis of marginal zone lymphoma from a bone marrow biopsy on June 11, 2021 the patient had a PET scan that was otherwise unrevealing.  After long discussion the patient began rituximab on July 16, 2021. The patient then began maintenance rituximab.  A scan in late December 2022 showed improvement in her splenomegaly.  Her last dose of rituximab was given August 31, 2022.  A CAT scan on December 01, 2022 showed resolution of the splenomegaly now 10.3 cm.     ==========================================     In addition to the history of splenic marginal zone lymphoma involving her bone marrow, the patient has a history of skin cancer and also generalized anxiety disorder.     The patient is originally from around [place] [facility].  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     The patient's past medical, surgical, social, and family histories were reviewed with the patient at this visit. I also reviewed the recent laboratory, pathology, and radiological studies. The patient is at risk of complications from her disease as well as complication of summer therapy.  I think she will benefit from ongoing surveillance of her physical exam, lab work and radiologic studies.     ==========================================    12/02/2022:    Crystal Cameron is a 67 year old female who presents in follow-up of her marginal zone lymphoma diagnosed in 05/2021.     She reports that overall, she is doing well. She has no new health concerns from her other providers since her last visit. She is accompanied today by her husband.     She is concerned about the findings of lumbar spondylolysis on her imaging report. She does experience  some lower back pain with standing or exertion.  I explained that this is more like a hairline crack over the bones is probably not related to her lymphoma.  It may be causing her some pain.  I told her I am not that worried about it myself.  She could visit with her primary care doctor if she wishes.  She already feels like her back pain is improving.    Her energy level is back to baseline. She started to feel better within a treatment or 2 after starting Rituxan. Her main symptom complaint was fatigue. She also mentions that she is tolerating the colder weather better this winter season than 2023. She feels like she is getting her strength back. She is trying to walk a bit every day for exercise. She denies any chest pain.     She had a calf stomp on half of her foot. She describes it turning black and blue. She then was stepped on in the same spot 2 more times.     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 The market is better in the month of September.     She notes to working around a lot of Round-Up weed killer in the past, but she has stopped using it now. They are now having trouble with [employer] weeds that are taking over their prairie grass. They were at 75 percent of the weeds, and they have controlled it to just needing to spot treat now. They are using an herbicide called Remedy now and they try to use caution when using it.     She has a family history significant for a maternal aunt with Hodgkin's lymphoma.    Her mother is 72 years old.     We reviewed her blood work, and her WBC, hemoglobin, and platelets are normal. Her lymphocytes are a bit low. The liver and kidney function looks normal. The LDH and IgG are normal.    We reviewed her CT scan of her abdomen, and it demonstrates resolution of splenomegaly and no upper abdominal lymphadenopathy or ascites. The pancreas and abdominal aorta are all patent. There is mild hepatic steatosis and an unchanged small low-density left hepatic lesion, too small to characterize. The imaging revealed some mild lumbar spondylosis.    We discussed that the hepatic lesion is unchanged and likely a normal finding for her. As long as it continues to be stable and shows no growth it is not a worrisome finding. We also discussed the lumbar findings are not cancer related. If she was to have bothersome symptoms, rest, exercise, medication, and physical therapy may be an option to explore.     Plan:    1. History of splenic marginal zone lymphoma.  She is doing very well and appears to be in clinical remission.    Follow-up  The patient will follow up in 3 months with labs and 6 months with labs. The patient will call if she has questions or symptoms of concern before that time.       Review of Systems   Constitutional:  Negative for chills and fever.   HENT:  Negative for nosebleeds.    Eyes:  Negative for pain and visual disturbance.   Respiratory:  Negative for cough and shortness of breath.    Cardiovascular:  Negative for chest pain and leg swelling.  Gastrointestinal:  Negative for abdominal pain.   Genitourinary:  Negative for hematuria.   Musculoskeletal:  Negative for joint swelling and neck pain.   Skin:  Negative for rash.   Neurological:  Negative for dizziness and weakness.   Hematological:  Negative for adenopathy.   Psychiatric/Behavioral:  Negative for confusion.          Objective:            acetaminophen (TYLENOL EXTRA STRENGTH) 500 mg tablet Take one tablet by mouth as Needed for Pain. Max of 4,000 mg of acetaminophen in 24 hours.    BIOTIN PO Take 6,000 mcg by mouth daily.    CALCIUM PO Take 1,200 mg by mouth daily.    ergocalciferol (vitamin D2) (VITAMIN D PO) Take 800 mg by mouth daily.    KRILL OIL PO Take 350 mg by mouth daily.    lutein 20 mg tab Take one tablet by mouth daily.    Multivitamins with Fluoride (MULTI-VITAMIN PO) Take  by mouth daily.    sertraline (ZOLOFT) 50 mg tablet Take one tablet by mouth daily.       Vitals:    12/02/22 1052   BP: 129/53   BP Source: Arm, Left Upper   Pulse: 73   Temp: 36.9 ?C (98.4 ?F)   Resp: 16   SpO2: 99%   O2 Device: None (Room air)   TempSrc: Oral   PainSc: Zero   Weight: 82 kg (180 lb 12.8 oz)     Body mass index is 32.24 kg/m?Marland Kitchen     Medical History:   Diagnosis Date    Cancer (HCC)     Cancer of skin     Vision decreased      Surgical History:   Procedure Laterality Date    CESAREAN SECTION      HAND SURGERY      BONE GRAFT    HX APPENDECTOMY      TONSILLECTOMY        Social History     Tobacco Use    Smoking status: Never    Smokeless tobacco: Never   Vaping Use    Vaping Use: Never used   Substance Use Topics    Alcohol use: Never    Drug use: Never     Family History   Problem Relation Age of Onset    Cancer Mother 60    Arthritis-rheumatoid Mother 75    Arthritis-osteo Mother 66    Cancer-Prostate Father     Cancer Maternal Aunt 19    Cancer Maternal Grandmother 89         Pain Score: Zero       Fatigue Scale: 0-None    Pain Addressed:  N/A    Patient Evaluated for a Clinical Trial: No treatment clinical trial available for this patient.     [place] Cooperative Oncology Group performance status is 0, Fully active, able to carry on all pre-disease performance without restriction.Marland Kitchen     Physical Exam  Vitals reviewed.   Constitutional:       General: She is not in acute distress.     Appearance: Normal appearance. She is well-developed and normal weight. She is not ill-appearing.   HENT:      Head: Normocephalic and atraumatic.   Eyes:      Conjunctiva/sclera: Conjunctivae normal.   Neck:      Vascular: No JVD.      Trachea: No tracheal deviation.   Cardiovascular:  Rate and Rhythm: Normal rate and regular rhythm.      Heart sounds:      No friction rub. No gallop.   Pulmonary:      Effort: Pulmonary effort is normal. No respiratory distress.      Breath sounds: Normal breath sounds. No stridor. No wheezing or rales.   Abdominal:      General: There is no distension.      Palpations: Abdomen is soft. There is no mass.      Tenderness: There is no abdominal tenderness.   Musculoskeletal:         General: Normal range of motion.      Cervical back: Normal range of motion. No rigidity.      Right lower leg: No edema.      Left lower leg: No edema.   Lymphadenopathy:      Head:      Right side of head: No submental or submandibular adenopathy.      Left side of head: No submental or submandibular adenopathy.      Cervical: No cervical adenopathy.      Right cervical: No superficial or posterior cervical adenopathy.     Left cervical: No superficial or posterior cervical adenopathy.      Upper Body:      Right upper body: No supraclavicular or epitrochlear adenopathy.      Left upper body: No supraclavicular or epitrochlear adenopathy.   Skin:     General: Skin is warm and dry.      Findings: No bruising, lesion or rash.   Neurological:      Mental Status: She is alert and oriented to person, place, and time.      Cranial Nerves: No cranial nerve deficit.      Motor: No weakness.      Gait: Gait normal.   Psychiatric:         Mood and Affect: Mood normal.         Behavior: Behavior normal.         Thought Content: Thought content normal.         Judgment: Judgment normal.             Assessment and Plan:    Patient Active Problem List    Diagnosis Date Noted    Splenic marginal zone b-cell lymphoma (HCC) 05/26/2021     Priority: Medium     05/14/2021 [facility] laboratory creatinine 1.1, transaminases alk phos total bili normal, TSH 1.1, W2.8 low, H11.2 Below 12, MCV 82.1, platelets 99 low, ANC 2300, lymphocytes 300 slightly low,  05/15/2021 [facility] in [place] CT abdomen pelvis liver is normal sign.  Spleen is massively enlarged measuring 20 cm x 19 cm.  No focal splenic lesions.  No adenopathy.  Diverticulosis throughout the sigmoid colon.  05/20/2021 [facility] medical clinic, [place].  67 year old female to review CAT scan that showed massive splenomegaly.  Lab work showed pancytopenia.  Patient says she has lost weight maybe 55 pounds since last fall.  Also reports weight loss and night sweats.  05/20/2021 [facility] BCR/ABL 1 and 2 not detected  05/22/2021 osl showed W2.3, H10.5, MCV 82, platelets 129, MPV 11.2 slightly elevated, 78% neutrophils  05/29/2021 [facility] peripheral smear pancytopenia.  06/03/2021 this is patient's first visit to [facility] office.  Reviewed imaging showing enlarged spleen along with exam feeling palpable spleen.  Talked  about peripheral smear not being helpful.  We will arrange for bone marrow biopsy with conscious sedation and follow-up about 3 weeks later.  She is aware that I am concerned about possible lymphoproliferative sorter or myeloproliferative disorder.  Her mother died from a T-cell lymphoma at an older age.  Her mother also had melanoma.  06/11/2021 [place] [facility] bone marrow CD5 negative/CD10 negative small B-cell lymphoma involving approxi-5-10% of cellular elements in a hypercellular bone marrow (60%) with trilineage hematopoiesis and 1% blast.  See comment.  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 - - - - consider PET and treatment with BR  06/12/2021 called and talked with patient about results of bone marrow showing marginal zone lymphoma.  Mention plans for PET scan.  Consider Bendamustine rituximab given every 4 weeks x 6 cycles.  Talked about risk for infection due to marginal zone lymphoma and pancytopenia but also risk of infection with both rituximab lymphocyte depletion as well as potential neutropenia with Bendamustine.  We will also do more think about whether single agent rituximab versus BR versus B TK inhibitor would be appropriate.  We will call patient after PET scan and arrange for teaching of appropriate therapy.  Patient will ask questions.  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  06/25/2021 [place] PET scan spleen a megaly with diffuse FDG uptake maximal SUV of 6.6 measuring 21 cm previously 20 cm.  No hypermetabolic lymphadenopathy.  06/25/2021 tried reaching patient.  No answer.  We will check to see if she has had HCV testing.  We will also check HBV testing.  If HCV positive might consider antiretroviral therapy.  If negative would consider rituximab single agent.  06/30/2021 [facility] hepatitis C virus antibody 0.2, negative.  Hepatitis B surface antigen screen negative, hepatitis B core antibody screen negative  07/06/2021 orders entered for Bendamustine rituximab with plans for 6 cycles depending upon tolerance and counts.  07/08/2021 discussed case with Dr. Mikey Bussing.  Will change treatment plan to rituximab 375 mg/metered squared for 4-8 weekly doses depending upon tolerance and benefit and if tolerated probably 4 additional doses at 30-month intervals or some other form of maintenance for 2 years.  This was discussed with the patient.  Orders will be entered.  We will also place orders for vaccines for Streptococcus, Neisseria meningitidis and haemophilus influenza.  07/15/21  Rituxan education.  Start tomorrow.  Pre-splenectomy vaccinations today.  Start allopurinol today.  1 week tox check.  FU with Dr. Madison Hickman in about 4 weeks.    07/23/21  Week 2 Rituxan.  Marked improvement in palpable spleen size.  Lab improved.  Sx significantly improved.  Continue weekly Rituxan and lab.   FU with Dr. Madison Hickman in 3 weeks as scheduled  08/13/2021 here for week 5 of planned date of Rituxan to be then followed by additional doses may be up to 2 years.  Has a mouth sore that may be viral.  We will probably try her antiviral.  If that does not work there is a mouth paste we may try.  She says belly began feeling better about 3 days after receiving therapy.  Energy improving.  Return to see [place] in 2 in 4 weeks.  Will maybe consider scan 2 months after that 1 more ready to start the next maintenance therapy.  09/11/2021 patient feeling much better.  Exam much improved.  Spleen at the edge of the ribs on inspiration.  We will plan on  every 2 month Rituxan for the next 2 years.  We will do a scan before return in 2 months.  To call if questions.  11/20/21: Doing well; mostly recovered from covid/rebound covid. Here for C1 of maintenance rituxan. Scans reviewed. Dr. Madison Hickman in two months with next treamtnet.   SPEP M spike 0.13  10/23/2021 [place] CT AP: Interval decrease in size of now mild splenomegaly.  Unchanged left likely low-density area involving the superior lateral aspect of spleen.  No significant abdominal pelvic adenopathy or ascites.  Spleen now measures 13.4 x 6.9 x 13 cm compared to 16.1 x 8.5 x 20.1.  01/15/22  #2 maintenance Rituxan.  No s/s progressive lymphoma.  Spleen non palpable.  Stable lab.  FU with Dr. Madison Hickman in 2 mos w/ lab.    03/12/2022 patient here for maintenance 5 of planned 8 of rituximab every 2 months.  Tolerating well.  Energy improving.  No unexplained fevers chills nausea or vomiting.  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  She will see [place] in 2 months in 4 months for additional rituximab.  05/07/2022 cycle 6 of planned 8 of rituximab.  She is on the maintenance portion.  She had about 2 weeks of a achy headache type feeling but also was out in the sun working hard after her last infusion.  We will see if it happens again.  Otherwise doing well.  Here with her husband.  We will see her in 2 months.  We talk about possible scan about 4 months after her last dose and then surveillance after that.  07/02/22  C7 maintenance rituxan.  No new sx or concerns to suggest recurrent lymphoma.  She will return to see Dr. Madison Hickman in 2 mos for last Rituxan infusion.   Imaging after that.  We discussed the COVID and influenza vaccinations and I suggested she get both when new COVID vaccine is released.    08/31/2022 cycle 8 and last dose of maintenance rituximab.  We will get back together in 2 or 3 months with a CAT scan of the abdomen and will probably go every 6 months after that.  She is vaccinated.  We talked about her dog having a form of early ketosis but different and also histoplasmosis.  She will call if she has additional questions  12/01/2022 [place] CT abdomen: ACT 10/23/2021 resolution of splenomegaly now measuring 10.3 previously 13 cm.  No upper abdominal adenopathy or ascites.- - - - - - - - -Good results  12/02/2022 discussed scan results with patient and husband.  Very pleased.  No enlarged lymph nodes on exam.  Lab looks great.  Will see [place] in 3 months in 6 weeks.  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        PMH: Generalized anxiety disorder    SH: Patient and husband both from the same small town I believe at [facility].  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      NCCN guidelines version 4.2023 splenic marginal zone lymphoma  Surveillance  Clinical follow-up every 3-6 months for 5 years then yearly or as clinically indicated.                                Pancytopenia (HCC)  06/12/2021     Priority: Low     06/12/2021 likely related to bone marrow involvement by marginal zone lymphoma.      Zoster without complications 05/26/2021    Other specified anxiety disorders 05/26/2021           Attestation:  This visit was documented by Josefine Class via audio recorded by Glean Salvo, MD on December 02, 2022, at 01:30 PM.

## 2022-12-02 NOTE — Telephone Encounter
Message on RN line from [place], stated that she'd been up to [place] today for lab and imaging. Has appointment with Dr. Jacqualyn Posey tomorrow and wondered if it could be a telephone appointment instead d/t weather. Called [place] to discuss, RN unaware of weather, but they have a long drive approximately 2 hours. Not able to do Zoom and internet connection spotty. Advised might be best to do in person if possible as may want to have new baseline physical exam. They will attempt to come in tomorrow, but if weather is poor will let us know and not come. Offered to move appointment to next week, which we may do if needed. Will let team know.

## 2023-03-03 ENCOUNTER — Encounter: Admit: 2023-03-03 | Discharge: 2023-03-03 | Payer: MEDICARE

## 2023-03-03 DIAGNOSIS — C8307 Small cell B-cell lymphoma, spleen: Secondary | ICD-10-CM

## 2023-03-03 DIAGNOSIS — F419 Anxiety disorder, unspecified: Secondary | ICD-10-CM

## 2023-03-03 DIAGNOSIS — T7840XA Allergy, unspecified, initial encounter: Secondary | ICD-10-CM

## 2023-03-03 DIAGNOSIS — C4490 Unspecified malignant neoplasm of skin, unspecified: Secondary | ICD-10-CM

## 2023-03-03 DIAGNOSIS — H547 Unspecified visual loss: Secondary | ICD-10-CM

## 2023-03-03 DIAGNOSIS — C8590 Non-Hodgkin lymphoma, unspecified, unspecified site: Secondary | ICD-10-CM

## 2023-03-03 DIAGNOSIS — C801 Malignant (primary) neoplasm, unspecified: Secondary | ICD-10-CM

## 2023-03-03 LAB — CBC AND DIFF
ABSOLUTE BASO COUNT: 0 K/UL (ref 0–0.20)
ABSOLUTE EOS COUNT: 0.1 K/UL (ref 0–0.45)
ABSOLUTE LYMPH COUNT: 0.7 K/UL — ABNORMAL LOW (ref 1.0–4.8)
ABSOLUTE MONO COUNT: 0.3 K/UL (ref 0–0.80)
ABSOLUTE NEUTROPHIL: 3.2 K/UL (ref 1.8–7.0)
BASOPHILS %: 1 % (ref 0–2)
EOSINOPHILS %: 2 % (ref >60)
HEMATOCRIT: 40 % (ref 36–45)
HEMOGLOBIN: 13 g/dL (ref 12.0–15.0)
LYMPHOCYTES %: 15 % — ABNORMAL LOW (ref 24–44)
MCH: 29 pg (ref 26–34)
MCHC: 33 g/dL (ref 32.0–36.0)
MCV: 86 FL (ref 80–100)
MONOCYTES %: 6 % (ref 4–12)
MPV: 9.2 FL (ref 7–11)
NEUTROPHILS %: 76 % (ref 41–77)
PLATELET COUNT: 183 K/UL (ref 150–400)
RBC COUNT: 4.6 M/UL (ref 4.0–5.0)
WBC COUNT: 4.3 K/UL — ABNORMAL LOW (ref 4.5–11.0)

## 2023-03-03 LAB — IMMUNOGLOBULIN G (IGG): IGG: 100 mg/dL (ref 762–1488)

## 2023-03-03 LAB — COMPREHENSIVE METABOLIC PANEL
POTASSIUM: 4 MMOL/L (ref 3.5–5.1)
SODIUM: 142 MMOL/L (ref 137–147)

## 2023-03-03 LAB — LDH-LACTATE DEHYDROGENASE: LDH: 188 U/L (ref 100–210)

## 2023-03-03 NOTE — Progress Notes
Name: Crystal Cameron          MRN: [id number]      DOB: August 20, 1956      AGE: 67 y.o.   DATE OF SERVICE: 03/03/2023    Subjective:             Reason for Visit:  No chief complaint on file.      Crystal Cameron is a 67 y.o. female.      Cancer Staging   Splenic marginal zone b-cell lymphoma (HCC)  Staging form: Hodgkin And Non-Hodgkin Lymphoma, AJCC 8th Edition  - Clinical stage from 05/07/2022: Stage IV (Marginal zone lymphoma) - Signed by Lady Deutscher, MD on 05/07/2022      History of Present Illness  Crystal Cameron is here today in FU of her marginal zone lymphoma.       She felt a mass in her abdomen.  CAT scan on July 1 showed massive enlargement of the spleen to 20 x 19 cm.  The liver looked normal.  There was no adenopathy.  Lab work showed a hemoglobin 11.2, platelets of 99, ANC of 2300, lymphocytopenia and a white count of 2.8.  The patient has lost 10 pounds in the last 4 months.  She denies night sweats, rash, itching.       PET on 06/25/21 showed marked splenomegaly with diffuse hypermetabolism compatible with lymphoma.  No LAD.  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       She reports abdominal fullness, early satiety and fatigue.       --  07/16/21  Start weekly Rituxan  --  11/20/21  Started maintenance Rituxan (Q2 mos x2 years)  --  12/01/22 CT AP shows NED    Interim  Hx:  No new sx or concerns.  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         Review of Systems   Constitutional:  Positive for fatigue (varies). Negative for diaphoresis, fever and unexpected weight change.   Respiratory: Negative.     Cardiovascular: Negative.    Gastrointestinal: Negative.    Genitourinary: Negative.    Musculoskeletal:  Positive for arthralgias (L hip - comes and goes).   Skin: Negative.  Negative for rash.   Neurological: Negative.    Hematological: Negative.  Negative for adenopathy.   Psychiatric/Behavioral: Negative. Negative for sleep disturbance.          Objective:          acetaminophen (TYLENOL EXTRA STRENGTH) 500 mg tablet Take one tablet by mouth as Needed for Pain. Max of 4,000 mg of acetaminophen in 24 hours.    BIOTIN PO Take 6,000 mcg by mouth daily.    CALCIUM PO Take 1,200 mg by mouth daily.    ergocalciferol (vitamin D2) (VITAMIN D PO) Take 800 mg by mouth daily.    KRILL OIL PO Take 350 mg by mouth daily.    lutein 20 mg tab Take one tablet by mouth daily.    Multivitamins with Fluoride (MULTI-VITAMIN PO) Take  by mouth daily.    sertraline (ZOLOFT) 50 mg tablet Take one tablet by mouth daily.     There were no vitals filed for this visit.  There is no height or weight on file to calculate BMI.                  Pain Addressed:  N/A  Patient Evaluated for a Clinical Trial: No treatment clinical trial available for this patient.     [place] Cooperative Oncology Group performance status is 0, Fully active, able to carry on all pre-disease performance without restriction.Marland Kitchen     Physical Exam  Vitals reviewed.   Constitutional:       Appearance: Normal appearance.   HENT:      Head: Normocephalic.   Cardiovascular:      Rate and Rhythm: Normal rate.   Pulmonary:      Effort: Pulmonary effort is normal.      Breath sounds: Normal breath sounds.   Musculoskeletal:         General: Normal range of motion.   Lymphadenopathy:      Head:      Right side of head: No submandibular or occipital adenopathy.      Left side of head: No submandibular or occipital adenopathy.      Cervical: No cervical adenopathy.      Upper Body:      Right upper body: No supraclavicular or axillary adenopathy.      Left upper body: No supraclavicular or axillary adenopathy.   Skin:     General: Skin is warm and dry.   Neurological:      General: No focal deficit present.      Mental Status: She is alert and oriented to person, place, and time.   Psychiatric:         Mood and Affect: Mood normal.         Behavior: Behavior normal.         Thought Content: Thought content normal.          Results for orders placed or performed during the hospital encounter of 03/03/23 (from the past 336 hour(s))   CBC AND DIFF   Result Value Ref Range    White Blood Cells 4.3 (L) 4.5 - 11.0 K/UL    RBC 4.63 4.0 - 5.0 M/UL    Hemoglobin 13.6 12.0 - 15.0 GM/DL    Hematocrit 16.1 36 - 45 %    MCV 86.7 80 - 100 FL    MCH 29.4 26 - 34 PG    MCHC 33.9 32.0 - 36.0 G/DL    RDW 09.6 11 - 15 %    Platelet Count 183 150 - 400 K/UL    MPV 9.2 7 - 11 FL    Neutrophils 76 41 - 77 %    Lymphocytes 15 (L) 24 - 44 %    Monocytes 6 4 - 12 %    Eosinophils 2 0 - 5 %    Basophils 1 0 - 2 %    Absolute Neutrophil Count 3.20 1.8 - 7.0 K/UL    Absolute Lymph Count 0.70 (L) 1.0 - 4.8 K/UL    Absolute Monocyte Count 0.30 0 - 0.80 K/UL    Absolute Eosinophil Count 0.10 0 - 0.45 K/UL    Absolute Basophil Count 0.00 0 - 0.20 K/UL          Assessment and Plan:       Patient Active Problem List    Diagnosis Date Noted    Pancytopenia (HCC) 06/12/2021     06/12/2021 likely related to bone marrow involvement by marginal zone lymphoma.      Splenic marginal zone b-cell lymphoma [facility]) 05/26/2021     05/14/2021 [facility] laboratory creatinine 1.1, transaminases alk phos total bili normal, TSH 1.1, W2.8 low, H11.2 Below 12,  MCV 82.1, platelets 99 low, ANC 2300, lymphocytes 300 slightly low,  05/15/2021 [facility] in [place] CT abdomen pelvis liver is normal sign.  Spleen is massively enlarged measuring 20 cm x 19 cm.  No focal splenic lesions.  No adenopathy.  Diverticulosis throughout the sigmoid colon.  05/20/2021 [facility] medical clinic, [place]. [place].  67 year old female to review CAT scan that showed massive splenomegaly.  Lab work showed pancytopenia.  Patient says she has lost weight maybe 55 pounds since last fall.  Also reports weight loss and night sweats.  05/20/2021 [facility] BCR/ABL 1 and 2 not detected  05/22/2021 osl showed W2.3, H10.5, MCV 82, platelets 129, MPV 11.2 slightly elevated, 78% neutrophils  05/29/2021 [facility] peripheral smear pancytopenia.  06/03/2021 this is patient's first visit to [facility] office.  Reviewed imaging showing enlarged spleen along with exam feeling palpable spleen.  Talked about peripheral smear not being helpful.  We will arrange for bone marrow biopsy with conscious sedation and follow-up about 3 weeks later.  She is aware that I am concerned about possible lymphoproliferative sorter or myeloproliferative disorder.  Her mother died from a T-cell lymphoma at an older age.  Her mother also had melanoma.  06/11/2021 [facility] bone marrow CD5 negative/CD10 negative small B-cell lymphoma involving approxi-5-10% of cellular elements in a hypercellular bone marrow (60%) with trilineage hematopoiesis and 1% blast.  See comment.  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 - - - - consider PET and treatment with BR  06/12/2021 called and talked with patient about results of bone marrow showing marginal zone lymphoma.  Mention plans for PET scan.  Consider Bendamustine rituximab given every 4 weeks x 6 cycles.  Talked about risk for infection due to marginal zone lymphoma and pancytopenia but also risk of infection with both rituximab lymphocyte depletion as well as potential neutropenia with Bendamustine.  We will also do more think about whether single agent rituximab versus BR versus B TK inhibitor would be appropriate.  We will call patient after PET scan and arrange for teaching of appropriate therapy.  Patient will ask questions.  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  06/25/2021 [place] PET scan spleen a megaly with diffuse FDG uptake maximal SUV of 6.6 measuring 21 cm previously 20 cm.  No hypermetabolic lymphadenopathy.  06/25/2021 tried reaching patient.  No answer.  We will check to see if she has had HCV testing.  We will also check HBV testing.  If HCV positive might consider antiretroviral therapy.  If negative would consider rituximab single agent.  06/30/2021 [facility] hepatitis C virus antibody 0.2, negative.  Hepatitis B surface antigen screen negative, hepatitis B core antibody screen negative  07/06/2021 orders entered for Bendamustine rituximab with plans for 6 cycles depending upon tolerance and counts.  07/08/2021 discussed case with Dr. Mikey Bussing.  Will change treatment plan to rituximab 375 mg/metered squared for 4-8 weekly doses depending upon tolerance and benefit and if tolerated probably 4 additional doses at 80-month intervals or some other form of maintenance for 2 years.  This was discussed with the patient.  Orders will be entered.  We will also place orders for vaccines for Streptococcus, Neisseria meningitidis and haemophilus influenza.  07/15/21  Rituxan education.  Start tomorrow.  Pre-splenectomy vaccinations today.  Start allopurinol today.  1 week tox check.  FU with Dr. Madison Hickman in about 4  weeks.    07/23/21  Week 2 Rituxan.  Marked improvement in palpable spleen size.  Lab improved.  Sx significantly improved.  Continue weekly Rituxan and lab.   FU with Dr. Madison Hickman in 3 weeks as scheduled  08/13/2021 here for week 5 of planned date of Rituxan to be then followed by additional doses may be up to 2 years.  Has a mouth sore that may be viral.  We will probably try her antiviral.  If that does not work there is a mouth paste we may try.  She says belly began feeling better about 3 days after receiving therapy.  Energy improving.  Return to see [place] in 2 in 4 weeks.  Will maybe consider scan 2 months after that 1 more ready to start the next maintenance therapy.  09/11/2021 patient feeling much better.  Exam much improved.  Spleen at the edge of the ribs on inspiration.  We will plan on every 2 month Rituxan for the next 2 years.  We will do a scan before return in 2 months. To call if questions.  11/20/21: Doing well; mostly recovered from covid/rebound covid. Here for C1 of maintenance rituxan. Scans reviewed. Dr. Madison Hickman in two months with next treamtnet.   SPEP M spike 0.13  10/23/2021 [place] CT AP: Interval decrease in size of now mild splenomegaly.  Unchanged left likely low-density area involving the superior lateral aspect of spleen.  No significant abdominal pelvic adenopathy or ascites.  Spleen now measures 13.4 x 6.9 x 13 cm compared to 16.1 x 8.5 x 20.1.  01/15/22  #2 maintenance Rituxan.  No s/s progressive lymphoma.  Spleen non palpable.  Stable lab.  FU with Dr. Madison Hickman in 2 mos w/ lab.    03/12/2022 patient here for maintenance 5 of planned 8 of rituximab every 2 months.  Tolerating well.  Energy improving.  No unexplained fevers chills nausea or vomiting.  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  She will see [place] in 2 months in 4 months for additional rituximab.  05/07/2022 cycle 6 of planned 8 of rituximab.  She is on the maintenance portion.  She had about 2 weeks of a achy headache type feeling but also was out in the sun working hard after her last infusion.  We will see if it happens again.  Otherwise doing well.  Here with her husband.  We will see her in 2 months.  We talk about possible scan about 4 months after her last dose and then surveillance after that.  07/02/22  C7 maintenance rituxan.  No new sx or concerns to suggest recurrent lymphoma.  She will return to see Dr. Madison Hickman in 2 mos for last Rituxan infusion.   Imaging after that.  We discussed the COVID and influenza vaccinations and I suggested she get both when new COVID vaccine is released.    08/31/2022 cycle 8 and last dose of maintenance rituximab.  We will get back together in 2 or 3 months with a CAT scan of the abdomen and will probably go every 6 months after that.  She is vaccinated.  We talked about her dog having a form of early ketosis but different and also histoplasmosis.  She will call if she has additional questions  12/01/2022 [place] CT abdomen: ACT 10/23/2021 resolution of splenomegaly now measuring 10.3 previously 13 cm.  No upper abdominal adenopathy or  ascites.- - - - - - - - -Good results  12/02/2022 discussed scan results with patient and husband.  Very pleased.  No enlarged lymph nodes on exam.  Lab looks great.  Will see [place] in 3 months in 6 weeks.  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  03/03/23  No B or other sx to suggested recurrent/progressive disease.  No enlarged LNs.  WBC and other lab stable.  FU with Dr. Georgiann Hahn as scheduled in 3 mos w/ lab.          PMH: Generalized anxiety disorder    SH: Patient and husband both from the same small town I believe at [facility].  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      NCCN guidelines version 4.2023 splenic marginal zone lymphoma  Surveillance  Clinical follow-up every 3-6 months for 5 years then yearly or as clinically indicated.

## 2023-06-03 ENCOUNTER — Encounter: Admit: 2023-06-03 | Discharge: 2023-06-03 | Payer: MEDICARE

## 2023-06-03 DIAGNOSIS — C8590 Non-Hodgkin lymphoma, unspecified, unspecified site: Secondary | ICD-10-CM

## 2023-06-03 DIAGNOSIS — C801 Malignant (primary) neoplasm, unspecified: Secondary | ICD-10-CM

## 2023-06-03 DIAGNOSIS — T7840XA Allergy, unspecified, initial encounter: Secondary | ICD-10-CM

## 2023-06-03 DIAGNOSIS — C8307 Small cell B-cell lymphoma, spleen: Secondary | ICD-10-CM

## 2023-06-03 DIAGNOSIS — G4719 Other hypersomnia: Secondary | ICD-10-CM

## 2023-06-03 DIAGNOSIS — R5383 Other fatigue: Secondary | ICD-10-CM

## 2023-06-03 DIAGNOSIS — C4490 Unspecified malignant neoplasm of skin, unspecified: Secondary | ICD-10-CM

## 2023-06-03 DIAGNOSIS — H547 Unspecified visual loss: Secondary | ICD-10-CM

## 2023-06-03 DIAGNOSIS — F419 Anxiety disorder, unspecified: Secondary | ICD-10-CM

## 2023-06-03 LAB — CBC AND DIFF
ABSOLUTE BASO COUNT: 0 10*3/uL (ref 0–0.20)
ABSOLUTE EOS COUNT: 0.1 10*3/uL (ref 0–0.45)
ABSOLUTE LYMPH COUNT: 0.8 10*3/uL — ABNORMAL LOW (ref 1.0–4.8)
ABSOLUTE MONO COUNT: 0.3 10*3/uL (ref 0–0.80)
ABSOLUTE NEUTROPHIL: 3.7 10*3/uL (ref 1.8–7.0)
BASOPHILS %: 1 % (ref 0–2)
EOSINOPHILS %: 1 % (ref >60)
HEMATOCRIT: 40 % (ref 36–45)
HEMOGLOBIN: 13 g/dL (ref 12.0–15.0)
LYMPHOCYTES %: 16 % — ABNORMAL LOW (ref 24–44)
MCH: 29 pg (ref 26–34)
MCHC: 33 g/dL (ref 32.0–36.0)
MCV: 87 FL (ref 80–100)
MONOCYTES %: 7 % (ref 4–12)
MPV: 9.5 FL (ref 7–11)
NEUTROPHILS %: 75 % — ABNORMAL HIGH (ref 41–77)
PLATELET COUNT: 186 10*3/uL (ref 150–400)
RBC COUNT: 4.6 M/UL (ref 4.0–5.0)
RDW: 13 % (ref 11–15)
WBC COUNT: 4.9 10*3/uL (ref 4.5–11.0)

## 2023-06-03 NOTE — Progress Notes
Name: Conchetta Molton          MRN: [id number]      DOB: 1955-11-29      AGE: 67 y.o.   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 female.       History of Present Illness    #---- Adalinn is here for follow-up of her marginal zone splenic lymphoma.  Her bone marrow in July, 2022,  showed marginal lymphoma and the PET scan was otherwise normal except for the spleen     #---- Elnita Maxwell had noticed when she put her arm across her abdomen that she thought she felt a mass.  They went to see the doctor.  A CAT scan in July 1 showed the spleen of 20 x 19 cm.  The liver looked normal. There is no adenopathy.  Lab work about that same time showed a hemoglobin 11.2, platelets of 99, and ANC of 2300 and lymphocytopenia and a white count of 2.8.  The patient has been trying to lose weight and maybe lost 10 pounds in the last 4 months.  She does have some sweats at night but that does not psych that is necessarily new.  She is not aware of any skin rashes.  After the diagnosis of marginal zone lymphoma from a bone marrow biopsy on June 11, 2021 the patient had a PET scan that was otherwise unrevealing.  After long discussion the patient began rituximab on July 16, 2021. The patient then began maintenance rituximab.  A scan in late December 2022 showed improvement in her splenomegaly.  Her last dose of rituximab was given August 31, 2022.  A CAT scan on December 01, 2022 showed resolution of the splenomegaly now 10.3 cm.     ==========================================     In addition to the history of splenic marginal zone lymphoma involving her bone marrow, the patient has a history of skin cancer and also generalized anxiety disorder.     The patient is originally from around [facility].  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     The patient's past medical, surgical, social, and family histories were reviewed with the patient at this visit. I also reviewed the recent laboratory, pathology, and radiological studies. The patient is at risk of complications from her disease as well as complication of summer therapy.  I think she will benefit from ongoing surveillance of her physical exam, lab work and radiologic studies.     ==========================================    06/03/2023  Ms. 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    She experienced a fall tripping over her puppy and her dog playing and running into her. She reports no new health concerns, medication changes or recent hospitalizations.     She reports frequent, low-grade headaches in the middle of the day, which intensify when she is fatigued. She attributes the headaches some to  chronic neck issues. She does not spend significant time on the computer or reading books but does use her phone often. Rubbing her head during these headaches provides some relief. She denies any associated arm or leg pain.    She has a lack of energy, a symptom she had previously experienced. She suspects her thyroid may be contributing to her low energy levels. Her thyroid levels were checked 2 years ago. She experiences fatigue in the afternoons, necessitating a nap. The heat exacerbates her symptoms. She reports poor sleep quality, which she attributes to her husband's snoring and his night time activities. She has not discussed her sleep issues with her primary care physician and has not been tested for sleep apnea. Her last Rituxan treatment was in 08/2022.    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 She has a family history significant for a mother with thyroid problems.    Laboratory Studies  White blood cell count is normal. Hemoglobin is 13. Red blood cell count is normal. Platelet count is normal. Percentage lymphocytes are slightly low, but absolute lymphocytes are fine.    Assessment and Plan  1. History of follicular lymphoma.  Completed rituximab approximately 8 months ago. No sign of recurrence by clinical exam. We will check thyroid function lab work and may consider sleep study, but she is not interested in doing that at this time. She will contact her with the results once they become available. She will let us know if she wishes to do the sleep study in the future.    Follow-up  We will schedule a follow-up appointment in 6 months with repeat lab work.        Review of Systems   Constitutional:  Positive for fatigue.   Musculoskeletal:  Positive for arthralgias.   Neurological:  Positive for headaches.   Psychiatric/Behavioral:  Positive for sleep disturbance.    All other systems reviewed and are negative.        Objective:            acetaminophen (TYLENOL EXTRA STRENGTH) 500 mg tablet Take one tablet by mouth as Needed for Pain. Max of 4,000 mg of acetaminophen in 24 hours.    BIOTIN PO Take 6,000 mcg by mouth daily.    CALCIUM PO Take 1,200 mg by mouth daily.    ergocalciferol (vitamin D2) (VITAMIN D PO) Take 800 mg by mouth daily.    KRILL OIL PO Take 350 mg by mouth daily.    lutein 20 mg tab Take one tablet by mouth daily.    Multivitamins with Fluoride (MULTI-VITAMIN PO) Take  by mouth daily.    sertraline (ZOLOFT) 50 mg tablet Take one tablet by mouth daily.       Vitals:    06/03/23 1337   BP: 130/68   BP Source: Arm, Left Upper   Pulse: 79   Temp: 37 ?C (98.6 ?F)   Resp: 16   SpO2: 100%   O2 Device: None (Room air)   TempSrc: Oral   PainSc: One   Weight: 81.6 kg (179 lb 12.8 oz)     Body mass index is 32.06 kg/m?Marland Kitchen     Past Medical History:   Diagnosis Date    Allergy     Anxiety Cancer [facility])     Cancer of skin     Non-Hodgkin's lymphoma [facility]) July 2022    Vision decreased      Surgical History:   Procedure Laterality Date    CESAREAN SECTION  FRACTURE SURGERY      HAND SURGERY      BONE GRAFT    HX APPENDECTOMY      HX CESAREAN SECTION      HX SKIN BIOPSY      Skin cancers removed    TONSILLECTOMY        Social History     Tobacco Use    Smoking status: Never    Smokeless tobacco: Never   Vaping Use    Vaping status: Never Used   Substance Use Topics    Alcohol use: Never    Drug use: Never     Family History   Problem Relation Name Age of Onset    Cancer Mother Ola Spurr 32    Arthritis-rheumatoid Mother Ola Spurr 63    Arthritis-osteo Mother Psychiatrist 89    Cancer-Prostate Father Automotive engineer     Cancer Maternal Aunt Wildrose 19    Cancer Maternal Grandmother Ggertrude Boulanger 89         Pain Score: One  Pain Loc: Head    Fatigue Scale: 0-None    Pain Addressed:  N/A    Patient Evaluated for a Clinical Trial: No treatment clinical trial available for this patient.     [place] Cooperative Oncology Group performance status is 1, Restricted in physically strenuous activity but ambulatory and able to carry out work of a light or sedentary nature, e.g., light house work, office work.     Physical Exam  Vitals reviewed.   Constitutional:       General: She is not in acute distress.     Appearance: Normal appearance. She is well-developed and normal weight. She is not ill-appearing.   HENT:      Head: Normocephalic and atraumatic.   Eyes:      Conjunctiva/sclera: Conjunctivae normal.   Neck:      Vascular: No JVD.      Trachea: No tracheal deviation.   Cardiovascular:      Rate and Rhythm: Normal rate and regular rhythm.      Heart sounds:      No friction rub. No gallop.   Pulmonary:      Effort: Pulmonary effort is normal. No respiratory distress.      Breath sounds: Normal breath sounds. No stridor. No wheezing or rales.   Abdominal:      General: There is no distension.      Palpations: Abdomen is soft. There is no mass.      Tenderness: There is no abdominal tenderness.   Musculoskeletal:         General: Normal range of motion.      Cervical back: Normal range of motion. No rigidity.      Right lower leg: No edema.      Left lower leg: No edema.   Lymphadenopathy:      Head:      Right side of head: No submental or submandibular adenopathy.      Left side of head: No submental or submandibular adenopathy.      Cervical: No cervical adenopathy.      Right cervical: No superficial or posterior cervical adenopathy.     Left cervical: No superficial or posterior cervical adenopathy.      Upper Body:      Right upper body: No supraclavicular or epitrochlear adenopathy.      Left upper body: No supraclavicular or epitrochlear adenopathy.   Skin:     General: Skin is  warm and dry.      Findings: No bruising, lesion or rash.   Neurological:      Mental Status: She is alert and oriented to person, place, and time.      Cranial Nerves: No cranial nerve deficit.      Motor: No weakness.      Gait: Gait normal.   Psychiatric:         Mood and Affect: Mood normal.         Behavior: Behavior normal.         Thought Content: Thought content normal.         Judgment: Judgment normal.               Assessment and Plan:     Patient Active Problem List    Diagnosis Date Noted    Splenic marginal zone b-cell lymphoma (HCC) 05/26/2021     Priority: Medium     05/14/2021 [facility] laboratory creatinine 1.1, transaminases alk phos total bili normal, TSH 1.1, W2.8 low, H11.2 Below 12, MCV 82.1, platelets 99 low, ANC 2300, lymphocytes 300 slightly low,  05/15/2021 [facility] in [place] CT abdomen pelvis liver is normal sign.  Spleen is massively enlarged measuring 20 cm x 19 cm.  No focal splenic lesions.  No adenopathy.  Diverticulosis throughout the sigmoid colon.  05/20/2021 [facility] medical clinic, [place]. [place].  67 year old female to review CAT scan that showed massive splenomegaly.  Lab work showed pancytopenia.  Patient says she has lost weight maybe 55 pounds since last fall.  Also reports weight loss and night sweats.  05/20/2021 [facility] BCR/ABL 1 and 2 not detected  05/22/2021 osl showed W2.3, H10.5, MCV 82, platelets 129, MPV 11.2 slightly elevated, 78% neutrophils  05/29/2021 [facility] peripheral smear pancytopenia.  06/03/2021 this is patient's first visit to [place] [facility] office.  Reviewed imaging showing enlarged spleen along with exam feeling palpable spleen.  Talked about peripheral smear not being helpful.  We will arrange for bone marrow biopsy with conscious sedation and follow-up about 3 weeks later.  She is aware that I am concerned about possible lymphoproliferative sorter or myeloproliferative disorder.  Her mother died from a T-cell lymphoma at an older age.  Her mother also had melanoma.  06/11/2021 [place] pathology bone marrow CD5 negative/CD10 negative small B-cell lymphoma involving approxi-5-10% of cellular elements in a hypercellular bone marrow (60%) with trilineage hematopoiesis and 1% blast.  See comment.  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 - - - - consider PET and treatment with BR  06/12/2021 called and talked with patient about results of bone marrow showing marginal zone lymphoma.  Mention plans for PET scan.  Consider Bendamustine rituximab given every 4 weeks x 6 cycles.  Talked about risk for infection due to marginal zone lymphoma and pancytopenia but also risk of infection with both rituximab lymphocyte depletion as well as potential neutropenia with Bendamustine.  We will also do more think about whether single agent rituximab versus BR versus B TK inhibitor would be appropriate.  We will call patient after PET scan and arrange for teaching of appropriate therapy.  Patient will ask questions. 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  06/25/2021 [place] PET scan spleen a megaly with diffuse FDG uptake maximal SUV of 6.6 measuring 21 cm previously 20 cm.  No hypermetabolic lymphadenopathy.  06/25/2021 tried reaching patient.  No answer.  We will check to see if she has had HCV testing.  We will also check HBV testing.  If HCV positive might consider antiretroviral therapy.  If negative would consider rituximab single agent.  06/30/2021 [facility] hepatitis C virus antibody 0.2, negative.  Hepatitis B surface antigen screen negative, hepatitis B core antibody screen negative  07/06/2021 orders entered for Bendamustine rituximab with plans for 6 cycles depending upon tolerance and counts.  07/08/2021 discussed case with Dr. Mikey Bussing.  Will change treatment plan to rituximab 375 mg/metered squared for 4-8 weekly doses depending upon tolerance and benefit and if tolerated probably 4 additional doses at 53-month intervals or some other form of maintenance for 2 years.  This was discussed with the patient.  Orders will be entered.  We will also place orders for vaccines for Streptococcus, Neisseria meningitidis and haemophilus influenza.  07/15/21  Rituxan education.  Start tomorrow.  Pre-splenectomy vaccinations today.  Start allopurinol today.  1 week tox check.  FU with Dr. Madison Hickman in about 4 weeks.    07/23/21  Week 2 Rituxan.  Marked improvement in palpable spleen size.  Lab improved.  Sx significantly improved.  Continue weekly Rituxan and lab.   FU with Dr. Madison Hickman in 3 weeks as scheduled  08/13/2021 here for week 5 of planned date of Rituxan to be then followed by additional doses may be up to 2 years.  Has a mouth sore that may be viral.  We will probably try her antiviral.  If that does not work there is a mouth paste we may try.  She says belly began feeling better about 3 days after receiving therapy.  Energy improving.  Return to see [place] in 2 in 4 weeks.  Will maybe consider scan 2 months after that 1 more ready to start the next maintenance therapy.  09/11/2021 patient feeling much better.  Exam much improved.  Spleen at the edge of the ribs on inspiration.  We will plan on every 2 month Rituxan for the next 2 years.  We will do a scan before return in 2 months.  To call if questions.  11/20/21: Doing well; mostly recovered from covid/rebound covid. Here for C1 of maintenance rituxan. Scans reviewed. Dr. Madison Hickman in two months with next treamtnet.   SPEP M spike 0.13  10/23/2021 [place] CT AP: Interval decrease in size of now mild splenomegaly.  Unchanged left likely low-density area involving the superior lateral aspect of spleen.  No significant abdominal pelvic adenopathy or ascites.  Spleen now measures 13.4 x 6.9 x 13 cm compared to 16.1 x 8.5 x 20.1.  01/15/22  #2 maintenance Rituxan.  No s/s progressive lymphoma.  Spleen non palpable.  Stable lab.  FU with Dr. Madison Hickman in 2 mos w/ lab.    03/12/2022 patient here for maintenance 5 of planned 8 of rituximab every 2 months.  Tolerating well.  Energy improving.  No unexplained fevers chills nausea or vomiting.  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  She will see [place] in 2 months in 4 months for additional rituximab.  05/07/2022 cycle 6 of planned 8 of rituximab.  She is on the maintenance portion.  She had about 2 weeks of a achy headache type feeling but also was out in the sun working hard after her last infusion.  We will see if it happens  again.  Otherwise doing well.  Here with her husband.  We will see her in 2 months.  We talk about possible scan about 4 months after her last dose and then surveillance after that.  07/02/22  C7 maintenance rituxan.  No new sx or concerns to suggest recurrent lymphoma.  She will return to see Dr. Madison Hickman in 2 mos for last Rituxan infusion.   Imaging after that.  We discussed the COVID and influenza vaccinations and I suggested she get both when new COVID vaccine is released.    08/31/2022 cycle 8 and last dose of maintenance rituximab.  We will get back together in 2 or 3 months with a CAT scan of the abdomen and will probably go every 6 months after that.  She is vaccinated.  We talked about her dog having a form of early ketosis but different and also histoplasmosis.  She will call if she has additional questions  12/01/2022 [place] CT abdomen: ACT 10/23/2021 resolution of splenomegaly now measuring 10.3 previously 13 cm.  No upper abdominal adenopathy or ascites.- - - - - - - - -Good results  12/02/2022 discussed scan results with patient and husband.  Very pleased.  No enlarged lymph nodes on exam.  Lab looks great.  Will see [place] in 3 months in 6 weeks.  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  03/03/23  No B or other sx to suggested recurrent/progressive disease.  No enlarged LNs.  WBC and other lab stable.  FU with Dr. Georgiann Hahn as scheduled in 3 mos w/ lab.          PMH: Generalized anxiety disorder    SH: Patient and husband both from the same small town I believe at [facility].  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      NCCN guidelines version 4.2023 splenic marginal zone lymphoma  Surveillance  Clinical follow-up every 3-6 months for 5 years then yearly or as clinically indicated.                                Pancytopenia (HCC) 06/12/2021     Priority: Low     06/12/2021 likely related to bone marrow involvement by marginal zone lymphoma.      Zoster without complications 05/26/2021    Other specified anxiety disorders 05/26/2021      This note was created with partial dictation using a dragon dictation system, please notify us if you notice any errors of omissions or content.   Glean Salvo, MD on June 03, 2023, at 03:00 PM.

## 2023-06-04 LAB — LDH-LACTATE DEHYDROGENASE: LDH: 189 U/L (ref 100–210)

## 2023-06-04 LAB — COMPREHENSIVE METABOLIC PANEL
POTASSIUM: 3.9 MMOL/L (ref 3.5–5.1)
SODIUM: 142 MMOL/L (ref 137–147)

## 2023-06-04 LAB — TSH WITH FREE T4 REFLEX: TSH: 1.5 uU/mL (ref 0.35–5.00)

## 2023-06-04 LAB — IMMUNOGLOBULIN G (IGG): IGG: 998 mg/dL (ref 762–1488)

## 2023-11-27 ENCOUNTER — Encounter: Admit: 2023-11-27 | Discharge: 2023-11-27 | Payer: MEDICARE

## 2023-12-02 ENCOUNTER — Encounter: Admit: 2023-12-02 | Discharge: 2023-12-02 | Payer: MEDICARE

## 2023-12-02 DIAGNOSIS — R5383 Other fatigue: Secondary | ICD-10-CM

## 2023-12-02 DIAGNOSIS — C8307 Small cell B-cell lymphoma, spleen: Secondary | ICD-10-CM

## 2023-12-02 DIAGNOSIS — G4719 Other hypersomnia: Secondary | ICD-10-CM

## 2023-12-02 NOTE — Progress Notes
Name: Crystal Cameron          MRN: [id number]      DOB: Jul 02, 1956      AGE: 68 y.o.   DATE OF SERVICE: 12/02/2023    Subjective:             Reason for Visit:  Follow Up      Crystal Cameron is a 68 y.o. female.      Cancer Staging   Splenic marginal zone b-cell lymphoma (HCC)  Staging form: Hodgkin And Non-Hodgkin Lymphoma, AJCC 8th Edition  - Clinical stage from 05/07/2022: Stage IV (Marginal zone lymphoma) - Signed by Lady Deutscher, MD on 05/07/2022      History of Present Illness  Crystal Cameron is here today in FU of her marginal zone lymphoma.       She felt a mass in her abdomen.  CAT scan on July 1 showed massive enlargement of the spleen to 20 x 19 cm.  The liver looked normal.  There was no adenopathy.  Lab work showed a hemoglobin 11.2, platelets of 99, ANC of 2300, lymphocytopenia and a white count of 2.8.  The patient has lost 10 pounds in the last 4 months.  She denies night sweats, rash, itching.       PET on 06/25/21 showed marked splenomegaly with diffuse hypermetabolism compatible with lymphoma.  No LAD.  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       She reports abdominal fullness, early satiety and fatigue.       --  07/16/21  Start weekly Rituxan  --  11/20/21  Started maintenance Rituxan (Q2 mos x2 years)  --  12/01/22 CT AP shows NED     Interim  Hx:  No weight loss, enlarged LNs, night sweats, excessive fatigue.    Occasional LUQ abd discomfort when bending over.         Review of Systems   Constitutional:  Positive for fatigue (varies). Negative for diaphoresis, fever and unexpected weight change.   Respiratory: Negative.     Cardiovascular: Negative.    Gastrointestinal:  Positive for abdominal pain.        Dyspepsia frequently     Genitourinary: Negative.    Skin: Negative.    Neurological:  Positive for headaches.   Hematological: Negative.  Negative for adenopathy. Does not bruise/bleed easily.   Psychiatric/Behavioral: Negative.     All other systems reviewed and are negative.        Objective:          acetaminophen (TYLENOL EXTRA STRENGTH) 500 mg tablet Take one tablet by mouth as Needed for Pain. Max of 4,000 mg of acetaminophen in 24 hours.    BIOTIN PO Take 6,000 mcg by mouth daily.    CALCIUM PO Take 1,200 mg by mouth daily.    ergocalciferol (vitamin D2) (VITAMIN D PO) Take 800 mg by mouth daily.    KRILL OIL PO Take 350 mg by mouth daily.    lutein 20 mg tab Take one tablet by mouth daily.    Multivitamins with Fluoride (MULTI-VITAMIN PO) Take  by mouth daily.    sertraline (ZOLOFT) 50 mg tablet Take one tablet by mouth daily.     Vitals:    12/02/23 1120   BP: (!) 140/71   BP Source: Arm, Left Upper   Pulse: 77   Temp: 37 ?C (98.6 ?F)   Resp: 16   SpO2: 98%   O2 Device:  None (Room air)   TempSrc: Oral   PainSc: Zero   Weight: 81.2 kg (179 lb)     Body mass index is 31.92 kg/m?Marland Kitchen     Pain Score: Zero       Fatigue Scale: 0-None    Pain Addressed:  N/A    Patient Evaluated for a Clinical Trial: No treatment clinical trial available for this patient.     [place] Cooperative Oncology Group performance status is 0, Fully active, able to carry on all pre-disease performance without restriction.Marland Kitchen     Physical Exam  Vitals reviewed.   Constitutional:       Appearance: Normal appearance.   HENT:      Head: Normocephalic.   Cardiovascular:      Rate and Rhythm: Normal rate.   Pulmonary:      Effort: Pulmonary effort is normal.      Breath sounds: Normal breath sounds.   Abdominal:      Tenderness: There is no abdominal tenderness.      Comments: Unable to palpate spleen or liver   Musculoskeletal:         General: Normal range of motion.      Cervical back: Normal range of motion.   Lymphadenopathy:      Head:      Right side of head: No submandibular or occipital adenopathy.      Left side of head: No submandibular or occipital adenopathy.      Cervical: No cervical adenopathy.      Upper Body:      Right upper body: No supraclavicular or axillary adenopathy.      Left upper body: No supraclavicular or axillary adenopathy.   Skin:     General: Skin is warm and dry.   Neurological:      General: No focal deficit present.      Mental Status: She is alert and oriented to person, place, and time.   Psychiatric:         Mood and Affect: Mood normal.         Behavior: Behavior normal.         Thought Content: Thought content normal.        Results for orders placed or performed in visit on 12/02/23 (from the past 2 weeks)   CBC AND DIFF   Result Value Ref Range    White Blood Cells 5.90 4.50 - 11.00 10*3/uL    Red Blood Cells 4.98 4.00 - 5.00 10*6/uL    Hemoglobin 14.3 12.0 - 15.0 g/dL    Hematocrit 16.1 09.6 - 45.0 %    MCV 87.1 80.0 - 100.0 fL    MCH 28.7 26.0 - 34.0 pg    MCHC 33.0 32.0 - 36.0 g/dL    RDW 04.5 40.9 - 81.1 %    Platelet Count 194 150 - 400 10*3/uL    MPV 8.9 7.0 - 11.0 fL    Neutrophils 77 41 - 77 %    Lymphocytes 16 (L) 24 - 44 %    Monocytes 6 4 - 12 %    Eosinophils 1 0 - 5 %    Basophils 1 0 - 2 %    Absolute Neutrophil Count 4.60 1.80 - 7.00 10*3/uL    Absolute Lymph Count 0.90 (L) 1.00 - 4.80 10*3/uL    Absolute Monocyte Count 0.30 0.00 - 0.80 10*3/uL    Absolute Eosinophil Count 0.00 0.00 - 0.45 10*3/uL    Absolute Basophil Count 0.00 0.00 -  0.20 10*3/uL          Assessment and Plan:       Patient Active Problem List    Diagnosis Date Noted    Pancytopenia (HCC) 06/12/2021     06/12/2021 likely related to bone marrow involvement by marginal zone lymphoma.      Splenic marginal zone b-cell lymphoma [facility]) 05/26/2021     05/14/2021 [facility] laboratory creatinine 1.1, transaminases alk phos total bili normal, TSH 1.1, W2.8 low, H11.2 Below 12, MCV 82.1, platelets 99 low, ANC 2300, lymphocytes 300 slightly low,  05/15/2021 [facility] in [place] CT abdomen pelvis liver is normal sign.  Spleen is massively enlarged measuring 20 cm x 19 cm.  No focal splenic lesions.  No adenopathy.  Diverticulosis throughout the sigmoid colon.  05/20/2021 [facility], [place]. [place].  68 year old female to review CAT scan that showed massive splenomegaly.  Lab work showed pancytopenia.  Patient says she has lost weight maybe 55 pounds since last fall.  Also reports weight loss and night sweats.  05/20/2021 [facility] BCR/ABL 1 and 2 not detected  05/22/2021 osl showed W2.3, H10.5, MCV 82, platelets 129, MPV 11.2 slightly elevated, 78% neutrophils  05/29/2021 [facility] peripheral smear pancytopenia.  06/03/2021 this is patient's first visit to [facility] office.  Reviewed imaging showing enlarged spleen along with exam feeling palpable spleen.  Talked about peripheral smear not being helpful.  We will arrange for bone marrow biopsy with conscious sedation and follow-up about 3 weeks later.  She is aware that I am concerned about possible lymphoproliferative sorter or myeloproliferative disorder.  Her mother died from a T-cell lymphoma at an older age.  Her mother also had melanoma.  06/11/2021 [place] [facility] bone marrow CD5 negative/CD10 negative small B-cell lymphoma involving approxi-5-10% of cellular elements in a hypercellular bone marrow (60%) with trilineage hematopoiesis and 1% blast.  See comment.  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 - - - - consider PET and treatment with BR  06/12/2021 called and talked with patient about results of bone marrow showing marginal zone lymphoma.  Mention plans for PET scan.  Consider Bendamustine rituximab given every 4 weeks x 6 cycles.  Talked about risk for infection due to marginal zone lymphoma and pancytopenia but also risk of infection with both rituximab lymphocyte depletion as well as potential neutropenia with Bendamustine.  We will also do more think about whether single agent rituximab versus BR versus B TK inhibitor would be appropriate.  We will call patient after PET scan and arrange for teaching of appropriate therapy.  Patient will ask questions.  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  06/25/2021  PET scan spleen a megaly with diffuse FDG uptake maximal SUV of 6.6 measuring 21 cm previously 20 cm.  No hypermetabolic lymphadenopathy.  06/25/2021 tried reaching patient.  No answer.  We will check to see if she has had HCV testing.  We will also check HBV testing.  If HCV positive might consider antiretroviral therapy.  If negative would consider rituximab single agent.  06/30/2021 [facility] hepatitis C virus antibody 0.2, negative.  Hepatitis B surface antigen screen negative, hepatitis B core antibody screen negative  07/06/2021 orders entered for Bendamustine rituximab with plans for 6 cycles depending upon tolerance and counts.  07/08/2021 discussed case with Dr. Mikey Bussing.  Will change treatment plan to rituximab  375 mg/metered squared for 4-8 weekly doses depending upon tolerance and benefit and if tolerated probably 4 additional doses at 70-month intervals or some other form of maintenance for 2 years.  This was discussed with the patient.  Orders will be entered.  We will also place orders for vaccines for Streptococcus, Neisseria meningitidis and haemophilus influenza.  07/15/21  Rituxan education.  Start tomorrow.  Pre-splenectomy vaccinations today.  Start allopurinol today.  1 week tox check.  FU with Dr. Madison Hickman in about 4 weeks.    07/23/21  Week 2 Rituxan.  Marked improvement in palpable spleen size.  Lab improved.  Sx significantly improved.  Continue weekly Rituxan and lab.   FU with Dr. Madison Hickman in 3 weeks as scheduled  08/13/2021 here for week 5 of planned date of Rituxan to be then followed by additional doses may be up to 2 years.  Has a mouth sore that may be viral.  We will probably try her antiviral.  If that does not work there is a mouth paste we may try.  She says belly began feeling better about 3 days after receiving therapy.  Energy improving.  Return to see [place] in 2 in 4 weeks.  Will maybe consider scan 2 months after that 1 more ready to start the next maintenance therapy.  09/11/2021 patient feeling much better.  Exam much improved.  Spleen at the edge of the ribs on inspiration.  We will plan on every 2 month Rituxan for the next 2 years.  We will do a scan before return in 2 months.  To call if questions.  11/20/21: Doing well; mostly recovered from covid/rebound covid. Here for C1 of maintenance rituxan. Scans reviewed. Dr. Madison Hickman in two months with next treamtnet.   SPEP M spike 0.13  10/23/2021 [place] CT AP: Interval decrease in size of now mild splenomegaly.  Unchanged left likely low-density area involving the superior lateral aspect of spleen.  No significant abdominal pelvic adenopathy or ascites.  Spleen now measures 13.4 x 6.9 x 13 cm compared to 16.1 x 8.5 x 20.1.  01/15/22  #2 maintenance Rituxan.  No s/s progressive lymphoma.  Spleen non palpable.  Stable lab.  FU with Dr. Madison Hickman in 2 mos w/ lab.    03/12/2022 patient here for maintenance 5 of planned 8 of rituximab every 2 months.  Tolerating well.  Energy improving.  No unexplained fevers chills nausea or vomiting.  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  She will see [place] in 2 months in 4 months for additional rituximab.  05/07/2022 cycle 6 of planned 8 of rituximab.  She is on the maintenance portion.  She had about 2 weeks of a achy headache type feeling but also was out in the sun working hard after her last infusion.  We will see if it happens again.  Otherwise doing well.  Here with her husband.  We will see her in 2 months.  We talk about possible scan about 4 months after her last dose and then surveillance after that.  07/02/22  C7 maintenance rituxan.  No new sx or concerns to suggest recurrent lymphoma.  She will return to see Dr. Madison Hickman in 2 mos for last Rituxan infusion.   Imaging after that.  We discussed the COVID and influenza vaccinations and I suggested she get both when new COVID vaccine is released.  08/31/2022 cycle 8 and last dose of maintenance rituximab.  We will get back together in 2 or 3 months with a CAT scan of the abdomen and will probably go every 6 months after that.  She is vaccinated.  We talked about her dog having a form of early ketosis but different and also histoplasmosis.  She will call if she has additional questions  12/01/2022 [place] CT abdomen: ACT 10/23/2021 resolution of splenomegaly now measuring 10.3 previously 13 cm.  No upper abdominal adenopathy or ascites.- - - - - - - - -Good results  12/02/2022 discussed scan results with patient and husband.  Very pleased.  No enlarged lymph nodes on exam.  Lab looks great.  Will see [place] in 3 months in 6 weeks.  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  03/03/23  No B or other sx to suggested recurrent/progressive disease.  No enlarged LNs.  WBC and other lab stable.  FU with Dr. Georgiann Hahn as scheduled in 3 mos w/ lab.    12/02/23  No s/s to suggest recurrent lymphoma.  CBC WNL.   FU with Dr. Georgiann Hahn with lab as scheduled in 6 mos.          PMH: Generalized anxiety disorder    SH: Patient and husband both from the same small town I believe at [facility].  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      NCCN guidelines version 4.2023 splenic marginal zone lymphoma  Surveillance  Clinical follow-up every 3-6 months for 5 years then yearly or as clinically indicated.

## 2023-12-22 ENCOUNTER — Encounter: Admit: 2023-12-22 | Discharge: 2023-12-22 | Payer: MEDICARE

## 2024-05-27 ENCOUNTER — Encounter: Admit: 2024-05-27 | Discharge: 2024-05-27 | Payer: MEDICARE

## 2024-05-30 ENCOUNTER — Encounter: Admit: 2024-05-30 | Discharge: 2024-05-30 | Payer: MEDICARE

## 2024-06-01 ENCOUNTER — Encounter: Admit: 2024-06-01 | Discharge: 2024-06-01 | Payer: MEDICARE

## 2024-06-01 DIAGNOSIS — R5383 Other fatigue: Secondary | ICD-10-CM

## 2024-06-01 DIAGNOSIS — C8307 Small cell B-cell lymphoma, spleen: Principal | ICD-10-CM

## 2024-06-01 DIAGNOSIS — G4719 Other hypersomnia: Secondary | ICD-10-CM
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
## 2024-07-21 IMAGING — MG SCRNING DIGITAL BRST TOMOSYNTHESIS BILAT
6 of 10 series · 6 of 26 positions shown · non-contrast
Comparison: None
Digital 3D CC and MLO views were obtained.

[id number]
PROCEDURE: [id number]
REASON FOR EXAM: Screening Mammogram

[R CC synth-2D]
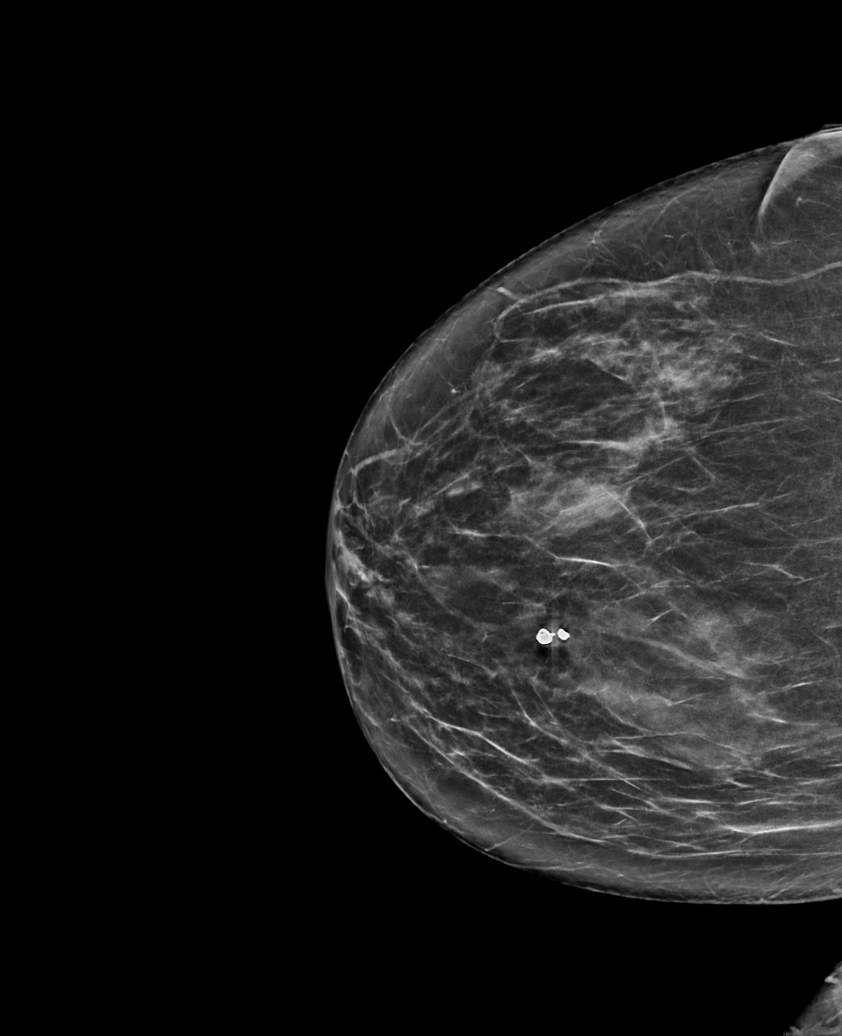

[L CC]
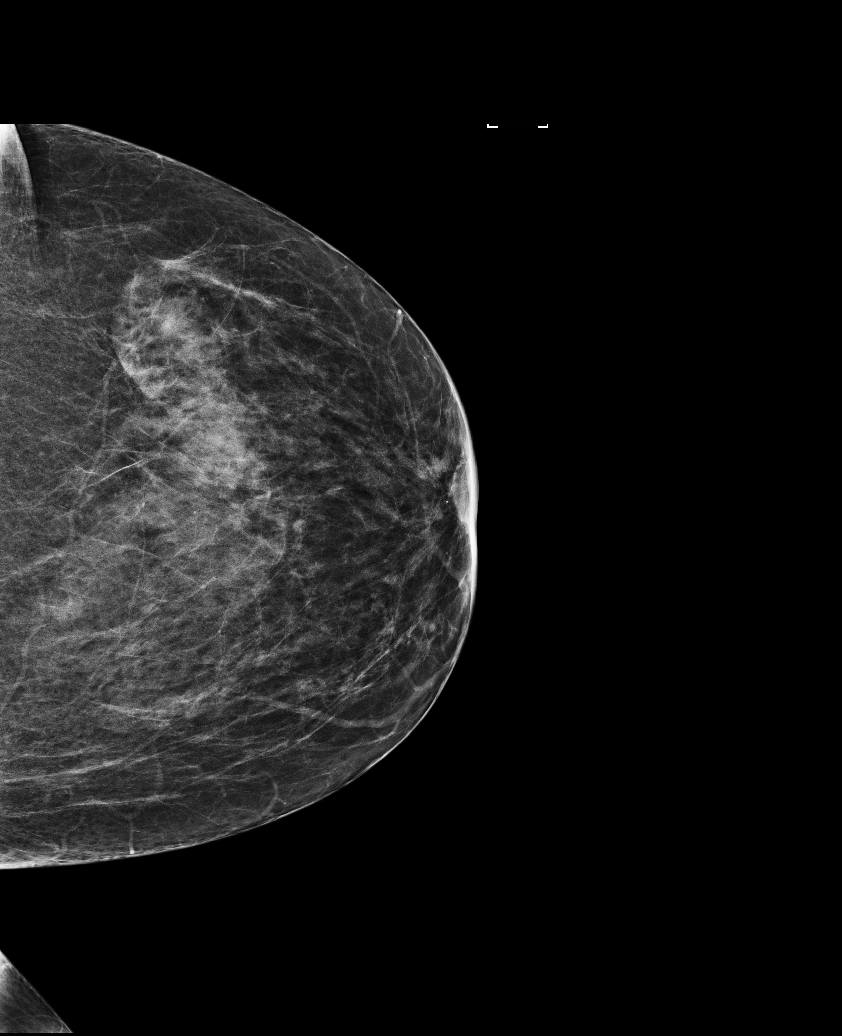

[L CC synth-2D]
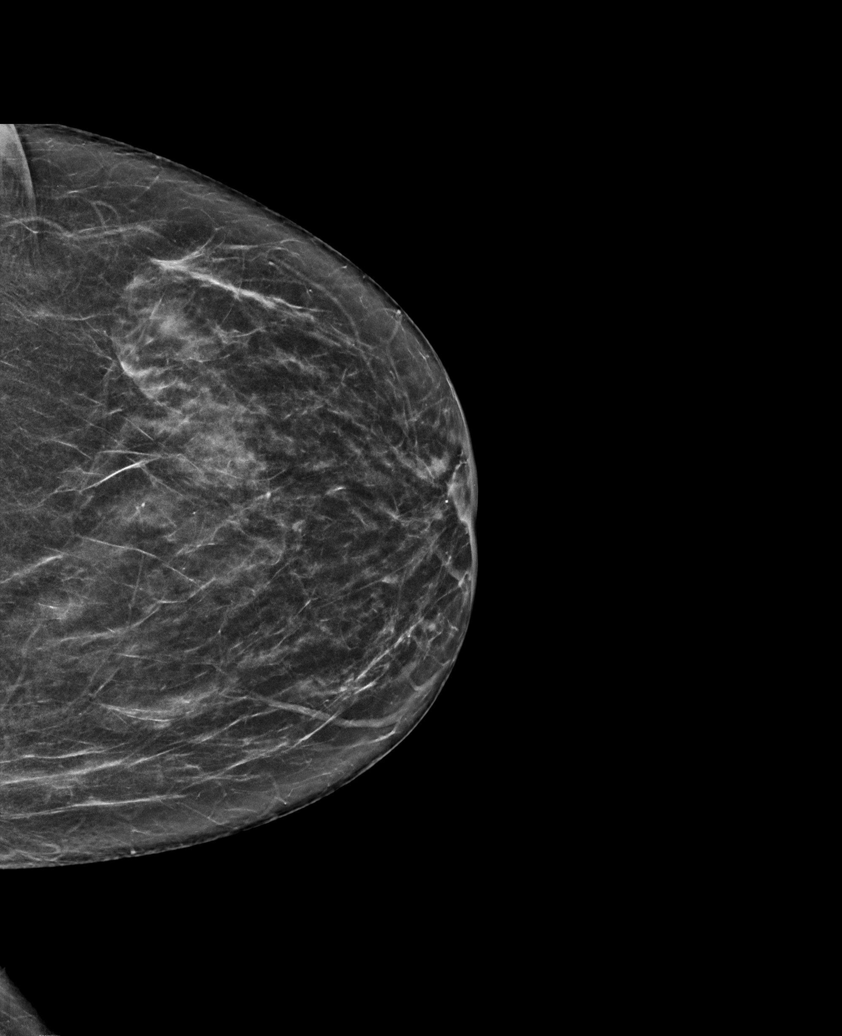

[L MLO synth-2D]
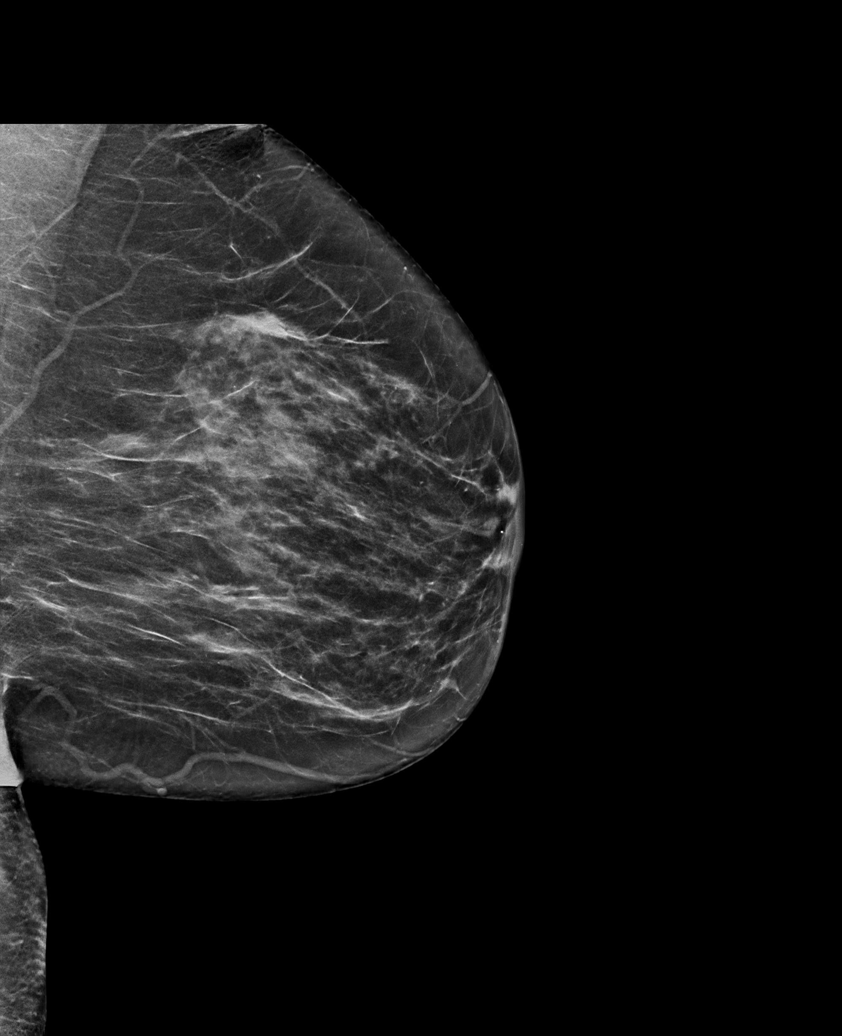

[R MLO synth-2D]
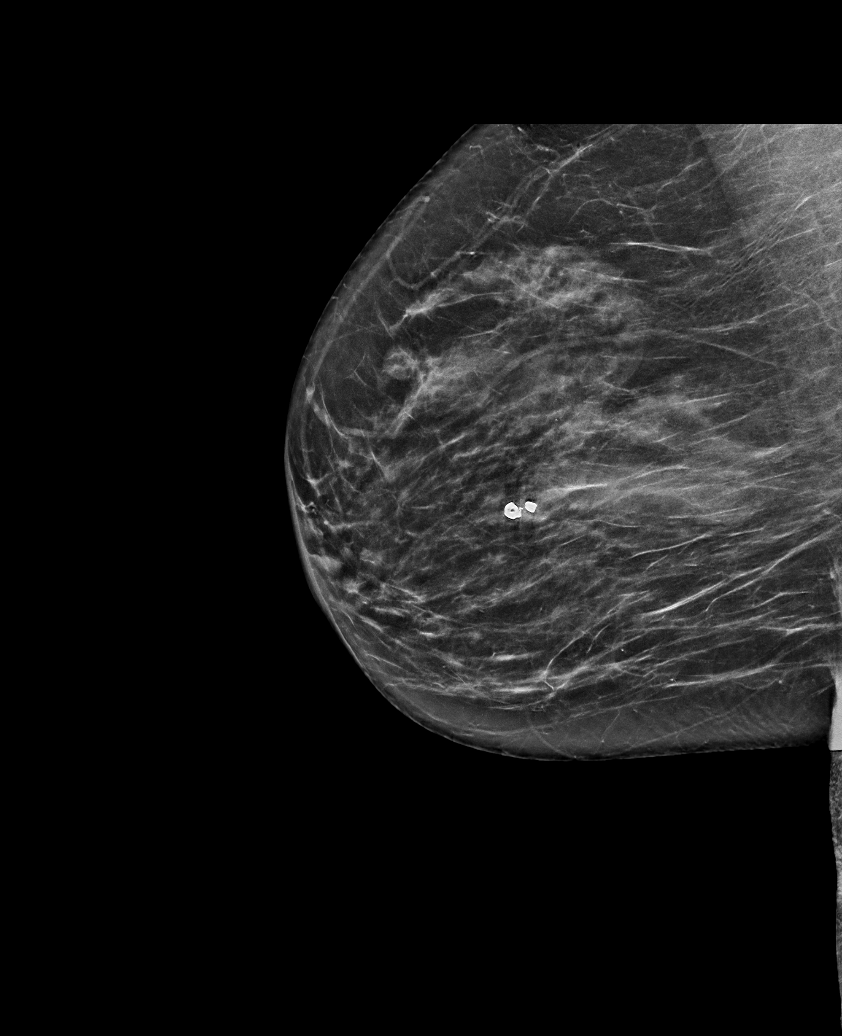

[R CC]
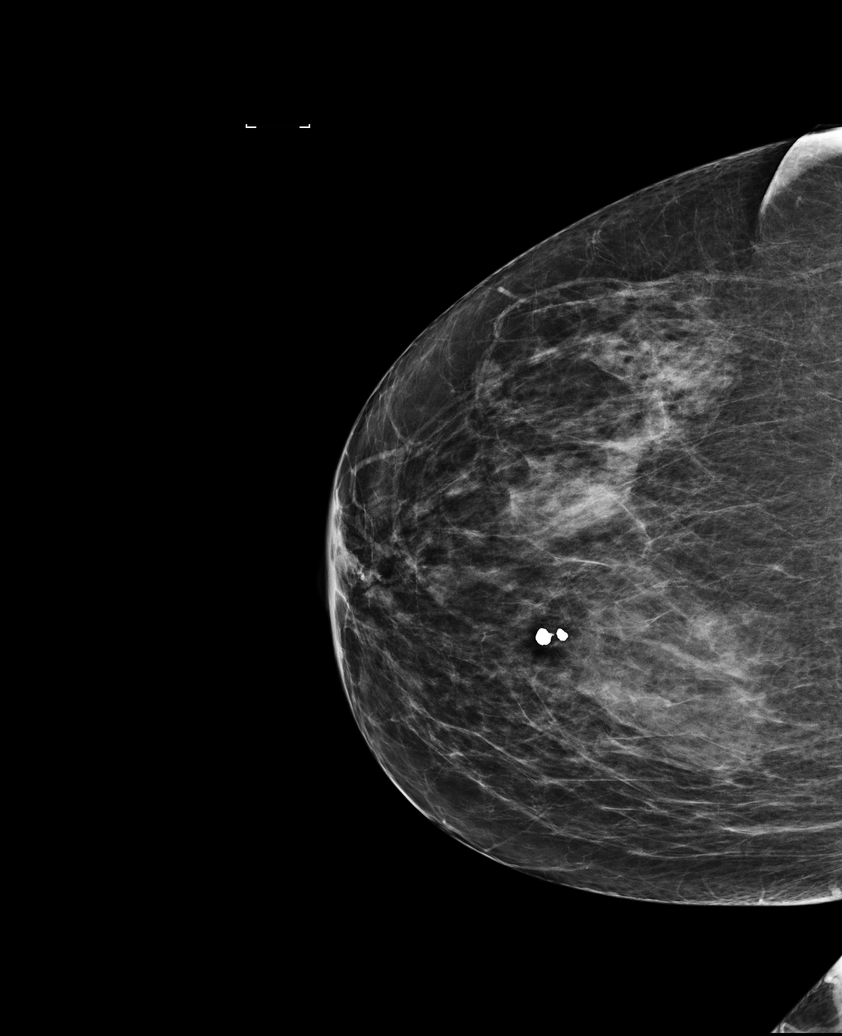

[6 of 26 positions shown; findings below may reference images not displayed]

FINDINGS: The breast tissue is heterogeneously dense, which may lower the sensitivity of
mammography. There is no dominant mass, suspicious cluster of microcalcifications, or other evidence
to indicate malignancy.
IMPRESSION: Negative mammogram without evidence of malignancy.
Result Code: (NEG) BI-RADS 1:  Negative
Follow up: (YR) Yearly
NOTE:  Keep in mind that about 10% of breast cancers will not be identified on mammography.  Further
evaluation of a palpable mass not seen on mammography should be based on clinical grounds, including
biopsy if indicated.

## 2024-07-24 ENCOUNTER — Encounter: Admit: 2024-07-24 | Discharge: 2024-07-24 | Payer: MEDICARE

## 2024-07-31 ENCOUNTER — Encounter: Admit: 2024-07-31 | Discharge: 2024-07-31 | Payer: MEDICARE

## 2024-08-01 ENCOUNTER — Encounter: Admit: 2024-08-01 | Discharge: 2024-08-01 | Payer: MEDICARE

## 2024-08-01 DIAGNOSIS — C8307 Small cell B-cell lymphoma, spleen: Principal | ICD-10-CM

## 2024-08-01 DIAGNOSIS — D709 Neutropenia, unspecified: Secondary | ICD-10-CM

## 2024-08-01 DIAGNOSIS — D696 Thrombocytopenia, unspecified: Secondary | ICD-10-CM

## 2024-08-01 NOTE — Telephone Encounter
-----   Message from JONELLE Eastern, MD sent at 08/01/2024 11:15 AM CDT -----  Regarding: Worsening white count and platelet count  Please tell the patient that I am a bit concerned that her white count.  This was repeated because her 1 recently was somewhat lower.  She had presented with cytopenias when her lymphoma originally   was diagnosed.  I would suggest that we arrange for CAT scans of the chest abdomen pelvis to see if her lymphoma is back as a cause of this decrease in her white count and platelet count.  There may   be other causes so it does not have to be lymphoma that is high in the list.  If she is willing, I would also check a B12 level, folic acid level and iron panel, a CBC, and a peripheral smear review.        Please ask her if she has had any unusual fevers, chills, nausea, or vomiting, or fatigue, or enlarging lymph nodes or abdominal pain since I last saw her.    Please tell her I am sorry and that I am not trying to scare her but I do not want to miss a sign that something is going on.  ----- Message -----  From: Leverne Malm, Background User  Sent: 07/31/2024  11:11 AM CDT  To: Charlie JINNY Eastern, MD

## 2024-08-02 ENCOUNTER — Encounter: Admit: 2024-08-02 | Discharge: 2024-08-02 | Payer: MEDICARE

## 2024-08-02 DIAGNOSIS — C8307 Small cell B-cell lymphoma, spleen: Secondary | ICD-10-CM

## 2024-08-02 DIAGNOSIS — D696 Thrombocytopenia, unspecified: Principal | ICD-10-CM

## 2024-08-02 DIAGNOSIS — D709 Neutropenia, unspecified: Secondary | ICD-10-CM

## 2024-08-02 NOTE — Telephone Encounter
 Addendum 08/03/23 at 1615 [place] called back. She had labs drawn at [facility] and they should be processing. She reports CT scans are scheduled for Tuesday next week and is scheduled for RTC with Dr. Jesusa on Thursday next week.

## 2024-08-02 NOTE — Telephone Encounter
 RN notified by lab that they could not add on iron panel or Vitamin B-12 level. Peripheral smear in progress, folate level resulted.     Dr. Jesusa advised that if labs do not show anything definitive he would like CT imaging as lymphoma is of concern and needs to be evaluated.     Called Crystal Cameron to discuss, but got voicemail and left message requesting call back.     Addendum 08/02/24 1245 contact made with Crystal Cameron. She would like to do the remaining labs at [facility] if possible. RN called Crystal Cameron ph [phone], but had to leave message. Faxed lab orders for iron panel and Vitamin B-12 to [facility], fax [phone]. Received report of successful fax transmission. RN called to confirm, spoke with Crystal Cameron. Apparently their faxes go to [place] office and get sent to them. She is checking to see if received and will notify our office if not received.     She agrees to setting CT scans in motion to get scheduled. Orders entered. Updated Dr. Jesusa.     Notified Crystal Cameron of communication. She will likely reach out to [facility] to set up lab draw.

## 2024-08-04 ENCOUNTER — Encounter: Admit: 2024-08-04 | Discharge: 2024-08-04 | Payer: MEDICARE

## 2024-08-05 ENCOUNTER — Encounter: Admit: 2024-08-05 | Discharge: 2024-08-05 | Payer: MEDICARE

## 2024-08-07 ENCOUNTER — Encounter: Admit: 2024-08-07 | Discharge: 2024-08-07 | Payer: MEDICARE

## 2024-08-09 ENCOUNTER — Encounter: Admit: 2024-08-09 | Discharge: 2024-08-09 | Payer: MEDICARE

## 2024-08-09 VITALS — BP 145/71 | HR 75 | Temp 97.80000°F | Resp 17 | Wt 180.6 lb

## 2024-08-09 DIAGNOSIS — C8307 Small cell B-cell lymphoma, spleen: Secondary | ICD-10-CM

## 2024-08-09 DIAGNOSIS — R5383 Other fatigue: Secondary | ICD-10-CM

## 2024-08-09 DIAGNOSIS — E611 Iron deficiency: Secondary | ICD-10-CM

## 2024-08-09 DIAGNOSIS — D696 Thrombocytopenia, unspecified: Principal | ICD-10-CM

## 2024-08-09 DIAGNOSIS — D709 Neutropenia, unspecified: Secondary | ICD-10-CM

## 2024-08-09 NOTE — Progress Notes
 Name: Crystal Cameron          MRN: [id number]      DOB: 09-28-1956      AGE: 68 y.o.   DATE OF SERVICE: 08/09/2024    Subjective:             Reason for Visit:  Heme/Onc Care      Crystal Cameron is a 68 y.o. female.     Cancer Staging   Splenic marginal zone b-cell lymphoma (CMS-HCC)  Staging form: Hodgkin And Non-Hodgkin Lymphoma, AJCC 8th Edition  - Clinical stage from 05/07/2022: Stage IV (Marginal zone lymphoma) - Signed by Jesusa Charlie PARAS, MD on 05/07/2022      History of Present Illness  #---- Crystal Cameron is here for follow-up of her marginal zone splenic lymphoma.  Her bone marrow in July, 2022,  showed marginal lymphoma and the PET scan was otherwise normal except for the spleen     #---- Crystal Cameron had noticed when she put her arm across her abdomen that she thought she felt a mass.  They went to see the doctor.  A CAT scan in July 1 showed the spleen of 20 x 19 cm.  The liver looked normal. There is no adenopathy.  Lab work about that same time showed a hemoglobin 11.2, platelets of 99, and ANC of 2300 and lymphocytopenia and a white count of 2.8.  The patient has been trying to lose weight and maybe lost 10 pounds in the last 4 months.  She does have some sweats at night but that does not psych that is necessarily new.  She is not aware of any skin rashes.  After the diagnosis of marginal zone lymphoma from a bone marrow biopsy on June 11, 2021 the patient had a PET scan that was otherwise unrevealing.  After long discussion the patient began rituximab  on July 16, 2021. The patient then began maintenance rituximab .  A scan in late December 2022 showed improvement in her splenomegaly.  Her last dose of rituximab  was given August 31, 2022.  A CAT scan on December 01, 2022 showed resolution of the splenomegaly now 10.3 cm.  A scan done in September 2025 showed this had increased to 13 cm.     ==========================================     In addition to the history of splenic marginal zone lymphoma involving her bone marrow, the patient has a history of skin cancer and also generalized anxiety disorder.     The patient is originally from around [facility].  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     The patient's past medical, surgical, social, and family histories were reviewed with the patient at this visit. I also reviewed the recent laboratory, pathology, and radiological studies. The patient is at risk of complications from her disease as well as complication of summer therapy.  I think she will benefit from ongoing surveillance of her physical exam, lab work and radiologic studies.     ==========================================  08/09/2024     Crystal Cameron is a 68 year old female with splenic lymphoma who presents for follow-up of splenomegaly and low blood counts.  She may also be having some new B symptoms or they may be from other issues.    She has a history of splenic lymphoma, previously treated with Rituxan . Her spleen, which was previously reduced to 10.3  cm in January 2024, has now enlarged to 13.2 cm.    She has been experiencing low blood counts, with a white blood cell count of 2.8 range. Recent blood work showed normal liver and kidney function tests, normal IgG levels, and a slightly elevated LDH at 219. Her peripheral smear was normal, and her platelets were noted to be clumping, with a count of 121.  Her iron levels are slightly on the low side.  She experiences easy bruising on her lower extremities but has no epistaxis or bleeding after brushing her teeth.    She feels fatigued, particularly in the afternoons, but her energy levels in the morning are adequate. She takes a B12 supplement, which she feels helps with her energy levels, and her B12 levels are normal.  Talking about her energy and fatigue it may not be much different over the last 6 months or much worse.  With the cooler weather she thinks she may be feeling better.    She has iron deficiency, with low iron levels noted in recent tests. She has started taking an iron gummy supplement but is unsure of the exact dosage.  She will let us  know the amount of elemental iron per gummy.  We have suggested 40-65 mg of elemental iron per day.  She has not experienced significant gastrointestinal side effects from the iron supplement.    No fevers or chills, but she feels better since the weather has cooled. The summer heat affects her more than it used to, which she attributes to aging. She remains active, walking every morning, and has no significant changes in her ability to perform daily activities.    She has had multiple tick bites over the summer, which have left her with persistent red marks. She and her husband have both experienced numerous tick bites, including from small deer ticks.             LABS  WBC: 2  IgG: Normal  LDH: 219  Peripheral smear: Normal  PLT: 121  B12: Normal  Iron: Low    RADIOLOGY  Abdominal CT: Splenomegaly with spleen enlarged to 13.2 cm, no lymphadenopathy, presence of cholelithiasis, no ascites             NEURO: alert, conversant  CV: heart regular rate  LUNGS: good respiratory effort, no significant ronchi, wheezes, stridor, or unusual effort  LYMPH NODES: no enlarged LN noted in neck, axilla, groin  ABD: nontender, no masses, the spleen might be palpable and very deep inspiration barely  SKIN: no unusual ecchymoses or rash       ASSESSMENT/PLAN:         Radiology relapsed splenic small B-cell lymphoma but unclear whether the patient is has any B symptoms  CT scan reveals an enlarged spleen measuring 13.2 cm, indicating relapse. No lymphadenopathy or other signs of cancer or infection. Spleen enlargement likely due to lymphoma recurrence. Blood counts are low, possibly due to iron deficiency. Rituxan  was effective previously and considered again if necessary. Newer therapies reserved for Rituxan  failure.  It is unclear at this time whether the patient's symptoms of fatigue are any worse and whether they are related to the lymphoma.  We are not initiating therapy at this time.  - Monitor symptoms and blood counts  - Consider Rituxan  if symptoms worsen or blood counts do not improve.  Again were not sure if her symptoms are from the lymphoma.  - Avoid CT scans unless symptomatic/or her symptoms worsen  -  Discuss potential future therapies if Rituxan  becomes ineffective  - Return in 3 months    Thrombocytopenia  Platelet count is 121, low but not associated with increased bruising or bleeding risk. Bruising not attributed to current platelet count. No immediate intervention required unless platelet count drops significantly.  - Monitor platelet count, also checking citrate platelet count and immature platelet fraction.  - Educate on signs of significant bleeding or bruising    Leukopenia  White blood cell count is low with no abnormal white cells on peripheral smear. Low count possibly due to past Rituxan  treatment or other non-lymphoma related causes. No immediate treatment required unless symptoms or counts worsen.  - Monitor white blood cell count, peripheral smear does not show any acute forms.    Iron deficiency  Iron levels are low, contributing to fatigue and general malaise. Hemoglobin is at the low end of normal, suggesting possible iron deficiency anemia.  - Continue iron supplementation with 40-65 mg of elemental iron daily  - Recheck iron and CBC levels in 6 weeks  - Monitor for gastrointestinal side effects from iron supplementation                 Review of Systems  Fatigue but not necessarily worse for the last 3 months.  Patient may be feeling better as the weather gets better.  We will also see if her fatigue improves with iron supplements    Objective:            acetaminophen  (TYLENOL  EXTRA STRENGTH) 500 mg tablet Take one tablet by mouth as Needed for Pain. Max of 4,000 mg of acetaminophen  in 24 hours.    BIOTIN PO Take 6,000 mcg by mouth daily.    CALCIUM PO Take 1,200 mg by mouth daily.    ergocalciferol (vitamin D2) (VITAMIN D PO) Take 800 mg by mouth daily.    KRILL OIL PO Take 350 mg by mouth daily.    lutein 20 mg tab Take one tablet by mouth daily.    Multivitamins with Fluoride (MULTI-VITAMIN PO) Take  by mouth daily.    sertraline (ZOLOFT) 50 mg tablet Take one tablet by mouth daily.       Vitals:    08/09/24 1103   BP: (!) 145/71   BP Source: Arm, Left Upper   Pulse: 75   Temp: 36.6 ?C (97.8 ?F)   Resp: 17   SpO2: 100%   O2 Device: None (Room air)   TempSrc: Oral   PainSc: Zero   Weight: 81.9 kg (180 lb 9.6 oz)     Body mass index is 33.03 kg/m?SABRA     Past Medical History:    Allergy    Anxiety    Back pain    Cancer (CMS-HCC)    Cancer of skin    Infection    Non-Hodgkin's lymphoma (CMS-HCC)    Vision decreased     Surgical History:   Procedure Laterality Date    CESAREAN SECTION      FRACTURE SURGERY      Hand surgery    HAND SURGERY      BONE GRAFT    HX APPENDECTOMY      HX CESAREAN SECTION  January 22 1086    HX SKIN BIOPSY      Skin cancers removed    SURGERY  Dec. 2019.   Jan.2020    Had skin cancer removed    TONSILLECTOMY        Social History  Tobacco Use    Smoking status: Never    Smokeless tobacco: Never   Vaping Use    Vaping status: Never Used   Substance Use Topics    Alcohol use: Never    Drug use: Never     Family History   Problem Relation Name Age of Onset    Cancer Mother Marlou Reach 38    Arthritis-rheumatoid Mother Marlou Reach 29    Arthritis-osteo Mother Sylven Hartzler 89    Cancer-Prostate Father Automotive engineer     Cancer Maternal Aunt [place] 19    Cancer Maternal Grandmother Ggertrude Boulanger 89         Pain Score: Zero       Fatigue Scale: 0-None    Pain Addressed:  N/A    Patient Evaluated for a Clinical Trial: No treatment clinical trial available for this patient.     [place] Cooperative Oncology Group performance status is 1, Restricted in physically strenuous activity but ambulatory and able to carry out work of a light or sedentary nature, e.g., light house work, office work.               Assessment and Plan:  Patient Active Problem List    Diagnosis Date Noted    Iron deficiency 08/05/2024     Priority: High     07/31/2024 W 2.4, H12.7 Previously mid 13 714, weight is 121 lower than previous 13-14, ANC 1000 low, LDH 219 elevated, CMP normal, IgG 1387, folate 9.6, peripheral smear platelet clumping observed which may artificially decrease measured platelet count.  08/02/2024 [facility] laboratory: Iron 39 low, iron saturation 13.26 low, ferritin 91  08/02/2024 [facility] laboratory vitamin B12 1107 normal  08/09/2024 patient began taking an iron gummy yesterday.  She will either send us  a picture or let us  know how much iron/elemental iron is each gummy.  She is currently taking 1 a day.  We told her we like her to take probably tween 40 and 65 mg of elemental iron daily or every other day.  We talked about tolerance.  We will check lab in 6 weeks CBC and iron.  We will then Cora Lenis either in 3 months with CBC and iron a week ahead of time.                Splenic marginal zone b-cell lymphoma (CMS-HCC) 05/26/2021     Priority: Medium     05/14/2021 [facility] laboratory creatinine 1.1, transaminases alk phos total bili normal, TSH 1.1, W2.8 low, H11.2 Below 12, MCV 82.1, platelets 99 low, ANC 2300, lymphocytes 300 slightly low,  05/15/2021 [facility] in [place]  CT abdomen pelvis liver is normal sign.  Spleen is massively enlarged measuring 20 cm x 19 cm.  No focal splenic lesions.  No adenopathy.  Diverticulosis throughout the sigmoid colon.  05/20/2021 [facility] medical clinic, [place]jelio HERO. [place].  68 year old female to review CAT scan that showed massive splenomegaly.  Lab work showed pancytopenia.  Patient says she has lost weight maybe 55 pounds since last fall.  Also reports weight loss and night sweats.  05/20/2021 [facility] BCR/ABL 1 and 2 not detected  05/22/2021 osl showed W2.3, H10.5, MCV 82, platelets 129, MPV 11.2 slightly elevated, 78% neutrophils  05/29/2021 [facility] peripheral smear pancytopenia.  06/03/2021 this is patient's first visit to [facility] office.  Reviewed imaging showing enlarged spleen along with exam feeling  palpable spleen.  Talked about peripheral smear not being helpful.  We will arrange for bone marrow biopsy with conscious sedation and follow-up about 3 weeks later.  She is aware that I am concerned about possible lymphoproliferative sorter or myeloproliferative disorder.  Her mother died from a T-cell lymphoma at an older age.  Her mother also had melanoma.  06/11/2021 [place] pathology bone marrow CD5 negative/CD10 negative small B-cell lymphoma involving approxi-5-10% of cellular elements in a hypercellular bone marrow (60%) with trilineage hematopoiesis and 1% blast.  See comment.  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 - - - - consider PET and treatment with BR  06/12/2021 called and talked with patient about results of bone marrow showing marginal zone lymphoma.  Mention plans for PET scan.  Consider Bendamustine rituximab  given every 4 weeks x 6 cycles.  Talked about risk for infection due to marginal zone lymphoma and pancytopenia but also risk of infection with both rituximab  lymphocyte depletion as well as potential neutropenia with Bendamustine.  We will also do more think about whether single agent rituximab  versus BR versus B TK inhibitor would be appropriate.  We will call patient after PET scan and arrange for teaching of appropriate therapy.  Patient will ask questions.  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  06/25/2021 [place] PET scan spleen a megaly with diffuse FDG uptake maximal SUV of 6.6 measuring 21 cm previously 20 cm.  No hypermetabolic lymphadenopathy.  06/25/2021 tried reaching patient.  No answer.  We will check to see if she has had HCV testing.  We will also check HBV testing.  If HCV positive might consider antiretroviral therapy.  If negative would consider rituximab  single agent.  06/30/2021 [facility] hepatitis C virus antibody 0.2, negative.  Hepatitis B surface antigen screen negative, hepatitis B core antibody screen negative  07/06/2021 orders entered for Bendamustine rituximab  with plans for 6 cycles depending upon tolerance and counts.  07/08/2021 discussed case with Dr. Rosan.  Will change treatment plan to rituximab  375 mg/metered squared for 4-8 weekly doses depending upon tolerance and benefit and if tolerated probably 4 additional doses at 60-month intervals or some other form of maintenance for 2 years.  This was discussed with the patient.  Orders will be entered.  We will also place orders for vaccines for Streptococcus, Neisseria meningitidis and haemophilus influenza.  07/15/21  Rituxan  education.  Start tomorrow.  Pre-splenectomy vaccinations today.  Start allopurinol  today.  1 week tox check.  FU with Dr. Evia in about 4 weeks.    07/23/21  Week 2 Rituxan .  Marked improvement in palpable spleen size.  Lab improved.  Sx significantly improved.  Continue weekly Rituxan  and lab.   FU with Dr. Evia in 3 weeks as scheduled  08/13/2021 here for week 5 of planned date of Rituxan  to be then followed by additional doses may be up to 2 years.  Has a mouth sore that may be viral.  We will probably try her antiviral.  If that does not work there is a mouth paste we may try.  She says belly began feeling better about 3 days after receiving therapy.  Energy improving.  Return to see us  in 2 in 4 weeks.  Will maybe consider scan 2 months after that 1 more ready to start the next maintenance therapy.  09/11/2021 patient feeling much better.  Exam much improved.  Spleen at the edge of the ribs on inspiration.  We will plan on every 2 month Rituxan  for the next 2 years.  We will do a scan before return in 2 months.  To call if questions.  11/20/21: Doing well; mostly recovered from covid/rebound covid. Here for C1 of maintenance rituxan . Scans reviewed. Dr. Evia in two months with next treamtnet.   SPEP M spike 0.13  10/23/2021 [place] CT AP: Interval decrease in size of now mild splenomegaly.  Unchanged left likely low-density area involving the superior lateral aspect of spleen.  No significant abdominal pelvic adenopathy or ascites.  Spleen now measures 13.4 x 6.9 x 13 cm compared to 16.1 x 8.5 x 20.1.  01/15/22  #2 maintenance Rituxan .  No s/s progressive lymphoma.  Spleen non palpable.  Stable lab.  FU with Dr. Evia in 2 mos w/ lab.    03/12/2022 patient here for maintenance 5 of planned 8 of rituximab  every 2 months.  Tolerating well.  Energy improving.  No unexplained fevers chills nausea or vomiting.  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  She will see us  in 2 months in 4 months for additional rituximab .  05/07/2022 cycle 6 of planned 8 of rituximab .  She is on the maintenance portion.  She had about 2 weeks of a achy headache type feeling but also was out in the sun working hard after her last infusion.  We will see if it happens again.  Otherwise doing well.  Here with her husband.  We will see her in 2 months.  We talk about possible scan about 4 months after her last dose and then surveillance after that.  07/02/22  C7 maintenance rituxan .  No new sx or concerns to suggest recurrent lymphoma.  She will return to see Dr. Evia in 2 mos for last Rituxan  infusion.   Imaging after that.  We discussed the COVID and influenza vaccinations and I suggested she get both when new COVID vaccine is released.    08/31/2022 cycle 8 and last dose of maintenance rituximab .  We will get back together in 2 or 3 months with a CAT scan of the abdomen and will probably go every 6 months after that.  She is vaccinated.  We talked about her dog having a form of early ketosis but different and also histoplasmosis.  She will call if she has additional questions  12/01/2022 [place] CT abdomen: ACT 10/23/2021 resolution of splenomegaly now measuring 10.3 previously 13 cm.  No upper abdominal adenopathy or ascites.- - - - - - - - -Good results  12/02/2022 discussed scan results with patient and husband.  Very pleased.  No enlarged lymph nodes on exam.  Lab looks great.  Will see us  in 3 months in 6 weeks.  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  03/03/23  No B or other sx to suggested recurrent/progressive disease.  No enlarged LNs.  WBC and other lab stable.  FU with Dr. Jesusa as scheduled in 3 mos w/ lab.    12/02/23  No s/s to suggest recurrent lymphoma.  CBC WNL.   FU with Dr. Jesusa with lab as scheduled in 6 mos.    06/01/2024 patient here with husband.  No new symptoms.  No enlarged lymph nodes labs look good.  See us  again in 6 months.  Lab count slightly lower than usual.  Suggesting CAT scan chest  abdomen pelvis  Platelet clumping  08/02/2024 [facility] laboratory vitamin B12 1107 normal  08/07/2024  CT CAP ACT pattern 06/25/2021 and CT AP 10/23/2021 and CT abdomen 12/01/2022.  No thoracic adenopathy pulmonary mass or pleural effusion.  Recurrent mild splenomegaly measuring 13.2 cm previously 10.3 cm.  Suggesting relapse lymphoma.  No abdominal pelvic lymphadenopathy or ascites.  Cholelithiasis and distal colonic diverticulosis.  08/09/2024 reviewed scan with patient.  The spleen is self growing is not an indication for therapy.  Her energy is somewhat better.  We talked about her iron deficiency.  If she is feeling better we will not need to initiate therapy if we do indeed with Rituxan  like we did several years ago.  She is glad to hear that she may not need Rituxan .  She will let us  know if she begins to feel worse with aches, fevers, chills, weight loss that she cannot explain.  Her husband was with her today.  Lab work in 6 weeks visit and lab work in 12 weeks        PMH: Marginal zone lymphoma of the spleen, generalized anxiety disorder    SH: Patient and husband both from the same small town I believe at [employer].  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      NCCN guidelines version 4.2023 splenic marginal zone lymphoma  Surveillance  Clinical follow-up every 3-6 months for 5 years then yearly or as clinically indicated.                                Zoster without complications 05/26/2021    Other specified anxiety disorders 05/26/2021      This note was created with partial dictation using a dragon dictation system, please notify us  if you notice any errors of omissions or content.

## 2024-08-14 ENCOUNTER — Encounter: Admit: 2024-08-14 | Discharge: 2024-08-14 | Payer: MEDICARE

## 2024-09-17 ENCOUNTER — Encounter: Admit: 2024-09-17 | Discharge: 2024-09-17 | Payer: MEDICARE

## 2024-09-17 NOTE — Telephone Encounter [36]
 Received a voice message from Analaura stating that she went to [facility] to have her labs drawn and they do not have the lab orders. Requested we fax them.    Faxed orders for CBC and Iron to [facility] at [phone].    Called Donielle to inform her that labs had been faxed. She will go have labs drawn towards the end of the week.

## 2024-09-20 ENCOUNTER — Encounter: Admit: 2024-09-20 | Discharge: 2024-09-20 | Payer: MEDICARE

## 2024-09-20 DIAGNOSIS — E611 Iron deficiency: Secondary | ICD-10-CM

## 2024-09-20 DIAGNOSIS — C8307 Small cell B-cell lymphoma, spleen: Principal | ICD-10-CM

## 2024-09-21 ENCOUNTER — Encounter: Admit: 2024-09-21 | Discharge: 2024-09-21 | Payer: MEDICARE

## 2024-11-05 ENCOUNTER — Encounter: Admit: 2024-11-05 | Discharge: 2024-11-05 | Payer: MEDICARE

## 2024-11-06 ENCOUNTER — Encounter: Admit: 2024-11-06 | Discharge: 2024-11-06 | Payer: MEDICARE

## 2024-11-13 ENCOUNTER — Encounter: Admit: 2024-11-13 | Discharge: 2024-11-13 | Payer: MEDICARE

## 2024-11-13 VITALS — BP 143/76 | HR 69 | Temp 98.20000°F | Resp 16 | Wt 183.4 lb

## 2024-11-13 DIAGNOSIS — C8307 Small cell B-cell lymphoma, spleen: Principal | ICD-10-CM

## 2024-11-13 DIAGNOSIS — E611 Iron deficiency: Secondary | ICD-10-CM

## 2024-11-13 NOTE — Progress Notes [1]
 Name: Crystal Cameron          MRN: [id number]      DOB: 28-Jul-1956      AGE: 68 y.o.   DATE OF SERVICE: 11/13/2024    Subjective:             Reason for Visit:  Heme/Onc Care      Crystal Cameron is a 68 y.o. female.     Cancer Staging   Splenic marginal zone b-cell lymphoma (CMS-HCC)  Staging form: Hodgkin And Non-Hodgkin Lymphoma, AJCC 8th Edition  - Clinical stage from 05/07/2022: Stage IV (Marginal zone lymphoma) - Signed by Jesusa Charlie PARAS, MD on 05/07/2022      History of Present Illness  #---- [name] is here for follow-up of her marginal zone splenic lymphoma.  Her bone marrow in July, 2022,  showed marginal lymphoma and the PET scan was otherwise normal except for the spleen     #---- [name] had noticed when she put her arm across her abdomen that she thought she felt a mass.  They went to see the doctor.  A CAT scan in July 1 showed the spleen of 20 x 19 cm.  The liver looked normal. There is no adenopathy.  Lab work about that same time showed a hemoglobin 11.2, platelets of 99, and ANC of 2300 and lymphocytopenia and a white count of 2.8.  The patient has been trying to lose weight and maybe lost 10 pounds in the last 4 months.  She does have some sweats at night but that does not psych that is necessarily new.  She is not aware of any skin rashes.  After the diagnosis of marginal zone lymphoma from a bone marrow biopsy on June 11, 2021 the patient had a PET scan that was otherwise unrevealing.  After long discussion the patient began rituximab  on July 16, 2021. The patient then began maintenance rituximab .  A scan in late December 2022 showed improvement in her splenomegaly.  Her last dose of rituximab  was given August 31, 2022.  A CAT scan on December 01, 2022 showed resolution of the splenomegaly now 10.3 cm.  A scan done in September 2025 showed this had increased to 13 cm.     ==========================================     In addition to the history of splenic marginal zone lymphoma involving her bone marrow, the patient has a history of skin cancer and also generalized anxiety disorder.     The patient is originally from around [facility].  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     The patient's past medical, surgical, social, and family histories were reviewed with the patient at this visit. I also reviewed the recent laboratory, pathology, and radiological studies. The patient is at risk of complications from her disease as well as complication of summer therapy.  I think she will benefit from ongoing surveillance of her physical exam, lab work and radiologic studies.     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    She is approximately 15 months post last Rituxan  treatment for splenic marginal zone lymphoma. Surveillance CT in September 2025 was performed. She continues to experience persistent fatigue, which initially improved with oral iron supplementation  but has since plateaued; she reports fluctuating energy levels and is not back to her baseline. Her iron studies remain low, and she continues oral iron therapy.    She describes intermittent left upper quadrant discomfort, particularly when bending over or after eating. The discomfort is not severe or limiting her activities but is more noticeable with certain movements or after meals. She recalls more severe symptoms during prior episodes.    She experienced an upper respiratory infection beginning the weekend before Thanksgiving, lasting approximately two weeks, characterized by sore throat, head congestion, and drainage without fever. She has since recovered and is currently asymptomatic from infection. She received a COVID vaccine booster in May 2025 and a flu vaccine in the fall, but has not yet received the most recent COVID booster due to illness and scheduling conflicts. She developed a sore throat three days after her flu shot, which she attributes to a viral infection.    She expresses concern regarding the possibility of requiring another bone marrow biopsy, citing prior opioid-induced nausea and prolonged recovery. She is currently involved in physically demanding home renovations and assists in the care of her husband with cardiac comorbidities.             LABS  WBC: elevated  Hb: normal  PLT: 138  Neutrophils: 3.4  Lymphocytes: low  Liver function tests: normal  Kidney function tests: normal  Immunoglobulin G: 1387  Immunoglobulin A: 998  Iron: low             NEURO: Alert, conversant.  CV: Heart regular rate.  LUNGS: Good respiratory effort, no significant ronchi, wheezes, stridor, or unusual effort.  LYMPH NODES: No enlarged lymph nodes in neck, axilla, groin.  ABD: Nontender, no masses. Spleen palpable on deep inspiration.  SKIN: No unusual ecchymoses or rash.       ASSESSMENT/PLAN:         Splenic marginal zone B-cell lymphoma  Splenic marginal zone B-cell lymphoma with mild cytopenias and splenomegaly. She experiences symptoms related to splenic enlargement, though these do not significantly limit her activities. Blood counts have shown relative stability with some improvement in hemoglobin and leukocyte count, possibly post-infectious. No evidence of organ dysfunction attributable to lymphoma. The disease course is slowly progressive, and re-treatment timing is under consideration. Rituximab  is expected to be effective again, with a similar duration of response as previously, and future consideration may be given to CAR T-cell therapy if needed. Risks of rituximab  include mild immunosuppression. Treatment initiation will depend on symptom progression and hematologic trends.  - Consult/commuincate with some fatigue some awareness of abdominal discomfort on bending over.  No unusual fevers chills or sweats.  Lymphoma specialist (Dr. Rosan) regarding timing and selection of next therapy, including repeat rituximab  versus newer options such as CAR T-cell therapy.  - Discussed that rituximab  is likely to be effective again, with similar duration of response; newer therapies may be considered if rituximab  is less effective or not tolerated.  - Plan to follow up in approximately three months, or sooner if symptoms worsen or cytopenias progress.  - Repeat CBC at a local center in 4 weeks to monitor for cytopenia.  - Will consider repeat CT scan prior to therapy initiation to establish a new baseline if treatment is started.    Iron deficiency  Ongoing iron deficiency without anemia. Oral iron supplementation initially improved symptoms, but energy levels have plateaued. Iron studies remain low, but hemoglobin is within normal limits. No evidence that lymphoma is contributing  to iron deficiency. IV iron is not indicated in the absence of anemia.  - Continue oral iron supplementation.  - Monitor iron studies and hemoglobin at next follow-up.  - No current plan for IV iron given absence of anemia and stable hemoglobin.                 Review of Systems  No unusual fevers chills or sweats.  Abdominal fullness on bending over    Objective:            acetaminophen  (TYLENOL  EXTRA STRENGTH) 500 mg tablet Take one tablet by mouth as Needed for Pain. Max of 4,000 mg of acetaminophen  in 24 hours.    BIOTIN PO Take 6,000 mcg by mouth daily.    CALCIUM PO Take 1,200 mg by mouth daily.    ergocalciferol (vitamin D2) (VITAMIN D PO) Take 800 mg by mouth daily.    ferrous sulfate (IRON PO) Take 65 mg by mouth daily.    KRILL OIL PO Take 350 mg by mouth daily.    lutein 20 mg tab Take one tablet by mouth daily.    Multivitamins with Fluoride (MULTI-VITAMIN PO) Take  by mouth daily.    sertraline (ZOLOFT) 50 mg tablet Take one tablet by mouth daily. Vitals:    11/13/24 1135   BP: (!) 143/76   BP Source: Arm, Left Upper   Pulse: 69   Temp: 36.8 ?C (98.2 ?F)   Resp: 16   SpO2: 100%   TempSrc: Oral   PainSc: Zero   Weight: 83.2 kg (183 lb 6.4 oz)     Body mass index is 33.54 kg/m?SABRA     Past Medical History:    Allergy    Anxiety    Back pain    Cancer (CMS-HCC)    Cancer of skin    Infection    Non-Hodgkin's lymphoma (CMS-HCC)    Unspecified deficiency anemia    Vision decreased     Surgical History:   Procedure Laterality Date    CESAREAN SECTION      FRACTURE SURGERY  2004    Hand surgery    HAND SURGERY      BONE GRAFT    HX APPENDECTOMY  2005    HX CESAREAN SECTION  January 22 1086    HX SKIN BIOPSY      Skin cancers removed    SURGERY  Dec. 2019.   Jan.2020    Had skin cancer removed    TONSILLECTOMY        Social History     Tobacco Use    Smoking status: Never    Smokeless tobacco: Never   Vaping Use    Vaping status: Never Used   Substance Use Topics    Alcohol use: Never    Drug use: Never     Family History   Problem Relation Name Age of Onset    Cancer Mother Marlou Reach 23    Arthritis-rheumatoid Mother Marlou Reach 69    Arthritis-osteo Mother Psychiatrist 15    Cancer-Prostate Father Automotive Engineer     Cancer Maternal Aunt Mellette 19    Cancer Maternal Grandmother Ggertrude Boulanger 89         Pain Score: Zero            Pain Addressed:  N/A    Patient Evaluated for a Clinical Trial: No treatment clinical trial available for this patient.     Eastern Cooperative Oncology Group performance status  is 0, Fully active, able to carry on all pre-disease performance without restriction..               Assessment and Plan:  Patient Active Problem List    Diagnosis Date Noted    Iron deficiency 08/05/2024     Priority: High     07/31/2024 W 2.4, H12.7 Previously mid 13 714, weight is 121 lower than previous 13-14, ANC 1000 low, LDH 219 elevated, CMP normal, IgG 1387, folate 9.6, peripheral smear platelet clumping observed which may artificially decrease measured platelet count.  08/02/2024 coffee [facility] laboratory: Iron 39 low, iron saturation 13.26 low, ferritin 91  08/02/2024 [facility] laboratory vitamin B12 1107 normal  08/09/2024 patient began taking an iron gummy yesterday.  She will either send us  a picture or let us  know how much iron/elemental iron is each gummy.  She is currently taking 1 a day.  We told her we like her to take probably tween 40 and 65 mg of elemental iron daily or every other day.  We talked about tolerance.  We will check lab in 6 weeks CBC and iron.  Then in 3 months with CBC and iron a week ahead of time.  09/19/2024 coffee [facility] W3.4, H13.4, platelets 159, ANC 2200, iron 42 low, iron saturation 14.9% low, ferritin 50  - - - - - - - -  acceptable though iron deficient  11/05/2024 iron 48 low, iron section 12% low, ferritin 48 on the low end, W4.4 stable, H13.6 Higher than the last 6 months, MCV 81.4 dropping from 86.5, platelets 138 slightly lower than a year ago but mostly stable, ANC 3400, CMP normal with creatinine 0.86, LDH 192, IgG 1238  - - - - - - - -  probably iron deficient  11/13/2024 iron deficient but not anemic.  Patient will continue oral iron.  Could plan on IV iron if hemoglobin or iron drops significantly              Splenic marginal zone b-cell lymphoma (CMS-HCC) 05/26/2021     Priority: Medium     05/14/2021 [facility] laboratory creatinine 1.1, transaminases alk phos total bili normal, TSH 1.1, W2.8 low, H11.2 Below 12, MCV 82.1, platelets 99 low, ANC 2300, lymphocytes 300 slightly low,  05/15/2021 [facility] in [place]  CT abdomen pelvis liver is normal sign.  Spleen is massively enlarged measuring 20 cm x 19 cm.  No focal splenic lesions.  No adenopathy.  Diverticulosis throughout the sigmoid colon.  05/20/2021 [facility] medical clinic, [place] HERO. [place].  68 year old female to review CAT scan that showed massive splenomegaly.  Lab work showed pancytopenia.  Patient says she has lost weight maybe 55 pounds since last fall.  Also reports weight loss and night sweats.  05/20/2021 [facility] BCR/ABL 1 and 2 not detected  05/22/2021 osl showed W2.3, H10.5, MCV 82, platelets 129, MPV 11.2 slightly elevated, 78% neutrophils  05/29/2021   [facility] peripheral smear pancytopenia.  06/03/2021 this is patient's first visit to [facility] office.  Reviewed imaging showing enlarged spleen along with exam feeling palpable spleen.  Talked about peripheral smear not being helpful.  We will arrange for bone marrow biopsy with conscious sedation and follow-up about 3 weeks later.  She is aware that I am concerned about possible lymphoproliferative sorter or myeloproliferative disorder.  Her mother died from a T-cell lymphoma at an older age.  Her mother also  had melanoma.  06/11/2021 [place] pathology bone marrow CD5 negative/CD10 negative small B-cell lymphoma involving approxi-5-10% of cellular elements in a hypercellular bone marrow (60%) with trilineage hematopoiesis and 1% blast.  See comment.  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 - - - - consider PET and treatment with BR  06/12/2021 called and talked with patient about results of bone marrow showing marginal zone lymphoma.  Mention plans for PET scan.  Consider Bendamustine rituximab  given every 4 weeks x 6 cycles.  Talked about risk for infection due to marginal zone lymphoma and pancytopenia but also risk of infection with both rituximab  lymphocyte depletion as well as potential neutropenia with Bendamustine.  We will also do more think about whether single agent rituximab  versus BR versus B TK inhibitor would be appropriate.  We will call patient after PET scan and arrange for teaching of appropriate therapy.  Patient will ask questions.  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  06/25/2021  PET scan spleen a megaly with diffuse FDG uptake maximal SUV of 6.6 measuring 21 cm previously 20 cm.  No hypermetabolic lymphadenopathy.  06/25/2021 tried reaching patient.  No answer.  We will check to see if she has had HCV testing.  We will also check HBV testing.  If HCV positive might consider antiretroviral therapy.  If negative would consider rituximab  single agent.  06/30/2021 [facility] hepatitis C virus antibody 0.2, negative.  Hepatitis B surface antigen screen negative, hepatitis B core antibody screen negative  07/06/2021 orders entered for Bendamustine rituximab  with plans for 6 cycles depending upon tolerance and counts.  07/08/2021 discussed case with Dr. Rosan.  Will change treatment plan to rituximab  375 mg/metered squared for 4-8 weekly doses depending upon tolerance and benefit and if tolerated probably 4 additional doses at 58-month intervals or some other form of maintenance for 2 years.  This was discussed with the patient.  Orders will be entered.  We will also place orders for vaccines for Streptococcus, Neisseria meningitidis and haemophilus influenza.  07/15/21  Rituxan  education.  Start tomorrow.  Pre-splenectomy vaccinations today.  Start allopurinol  today.  1 week tox check.  FU with Dr. Evia in about 4 weeks.    07/23/21  Week 2 Rituxan .  Marked improvement in palpable spleen size.  Lab improved.  Sx significantly improved.  Continue weekly Rituxan  and lab.   FU with Dr. Evia in 3 weeks as scheduled  08/13/2021 here for week 5 of planned date of Rituxan  to be then followed by additional doses may be up to 2 years.  Has a mouth sore that may be viral.  We will probably try her antiviral.  If that does not work there is a mouth paste we may try.  She says belly began feeling better about 3 days after receiving therapy.  Energy improving.  Return to see us  in 2 in 4 weeks.  Will maybe consider scan 2 months after that 1 more ready to start the next maintenance therapy.  09/11/2021 patient feeling much better.  Exam much improved.  Spleen at the edge of the ribs on inspiration.  We will plan on every 2 month Rituxan  for the next 2 years.  We will do a scan before return in 2 months.  To call if questions.  11/20/21: Doing well; mostly recovered from covid/rebound covid. Here for C1 of maintenance rituxan . Scans reviewed. Dr. Evia in two months with next treamtnet.   SPEP M  spike 0.13  10/23/2021 [place] CT AP: Interval decrease in size of now mild splenomegaly.  Unchanged left likely low-density area involving the superior lateral aspect of spleen.  No significant abdominal pelvic adenopathy or ascites.  Spleen now measures 13.4 x 6.9 x 13 cm compared to 16.1 x 8.5 x 20.1.  01/15/22  #2 maintenance Rituxan .  No s/s progressive lymphoma.  Spleen non palpable.  Stable lab.  FU with Dr. Evia in 2 mos w/ lab.    03/12/2022 patient here for maintenance 5 of planned 8 of rituximab  every 2 months.  Tolerating well.  Energy improving.  No unexplained fevers chills nausea or vomiting.  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  There is tootle and noodle and the third 1 who is a preemie is named Bella.  She will see us  in 2 months in 4 months for additional rituximab .  05/07/2022 cycle 6 of planned 8 of rituximab .  She is on the maintenance portion.  She had about 2 weeks of a achy headache type feeling but also was out in the sun working hard after her last infusion.  We will see if it happens again.  Otherwise doing well.  Here with her husband.  We will see her in 2 months.  We talk about possible scan about 4 months after her last dose and then surveillance after that.  07/02/22  C7 maintenance rituxan .  No new sx or concerns to suggest recurrent lymphoma.  She will return to see Dr. Evia in 2 mos for last Rituxan  infusion.   Imaging after that.  We discussed the COVID and influenza vaccinations and I suggested she get both when new COVID vaccine is released.    08/31/2022 cycle 8 and last dose of maintenance rituximab .  We will get back together in 2 or 3 months with a CAT scan of the abdomen and will probably go every 6 months after that.  She is vaccinated.  We talked about her dog having a form of early ketosis but different and also histoplasmosis.  She will call if she has additional questions  12/01/2022 [place] CT abdomen: ACT 10/23/2021 resolution of splenomegaly now measuring 10.3 previously 13 cm.  No upper abdominal adenopathy or ascites.- - - - - - - - -Good results  12/02/2022 discussed scan results with patient and husband.  Very pleased.  No enlarged lymph nodes on exam.  Lab looks great.  Will see us  in 3 months in 6 weeks.  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  03/03/23  No B or other sx to suggested recurrent/progressive disease.  No enlarged LNs.  WBC and other lab stable.  FU with Dr. Jesusa as scheduled in 3 mos w/ lab.    12/02/23  No s/s to suggest recurrent lymphoma.  CBC WNL.   FU with Dr. Jesusa with lab as scheduled in 6 mos.    06/01/2024 patient here with husband.  No new symptoms.  No enlarged lymph nodes labs look good.  See us  again in 6 months.  Lab count slightly lower than usual.  Suggesting CAT scan chest abdomen pelvis  Platelet clumping  08/02/2024 [facility] laboratory vitamin B12 1107 normal  08/07/2024 [place] CT CAP ACT pattern 06/25/2021 and CT AP 10/23/2021 and CT abdomen 12/01/2022.  No thoracic adenopathy pulmonary mass or pleural effusion.  Recurrent mild splenomegaly measuring 13.2 cm previously 10.3 cm.  Suggesting relapse lymphoma.  No abdominal pelvic  lymphadenopathy or ascites.  Cholelithiasis and distal colonic diverticulosis.  08/09/2024 reviewed scan with patient.  The spleen is self growing is not an indication for therapy.  Her energy is somewhat better.  We talked about her iron deficiency.  If she is feeling better we will not need to initiate therapy if we do indeed with Rituxan  like we did several years ago.  She is glad to hear that she may not need Rituxan .  She will let us  know if she begins to feel worse with aches, fevers, chills, weight loss that she cannot explain.  Her husband was with her today.  Lab work in 6 weeks visit and lab work in 12 weeks  08/09/2024.  Blue top platelet count platelet 138  11/13/2024 fatigue about the same.  Does have some pressure when she bends over.  Spleen palpable on inspiration.  No other enlarged lymph nodes.  She began dropping her counts about a year and a half after completion of rituximab .  Will check with MH to see if therapies other than rituximab  would be suggested such as clinical trial availability at this time.  Will otherwise check a blood count in 4 weeks and see us  in 12 weeks.    PMH: Marginal zone lymphoma of the spleen, generalized anxiety disorder    SH: Patient and husband both from the same small town I believe at [facility].  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      NCCN guidelines version 4.2023 splenic marginal zone lymphoma  Surveillance  Clinical follow-up every 3-6 months for 5 years then yearly or as clinically indicated.                                Zoster without complications 05/26/2021    Other specified anxiety disorders 05/26/2021      This note was created with partial dictation using a dragon dictation system, please notify us  if you notice any errors of omissions or content.

## 2024-11-14 ENCOUNTER — Encounter: Admit: 2024-11-14 | Discharge: 2024-11-14 | Payer: MEDICARE

## 2024-11-14 NOTE — Telephone Encounter [36]
 RN called PCP office at [facility] to confirm fax #. Faxed lab order for CBC and diff to be performed approximately 12/11/24 (4 weeks). Fax # [phone]. Received report of successful fax transmission. Sent MyChart message to Crystal Cameron that lab order was sent.

## 2024-11-19 ENCOUNTER — Encounter: Admit: 2024-11-19 | Discharge: 2024-11-19 | Payer: MEDICARE

## 2024-11-19 DIAGNOSIS — C8307 Small cell B-cell lymphoma, spleen: Principal | ICD-10-CM

## 2024-11-20 ENCOUNTER — Encounter: Admit: 2024-11-20 | Discharge: 2024-11-20 | Payer: MEDICARE

## 2024-11-27 ENCOUNTER — Encounter: Admit: 2024-11-27 | Discharge: 2024-11-27 | Payer: MEDICARE

## 2024-11-27 VITALS — BP 151/80 | HR 99 | Temp 97.60000°F | Resp 16 | Wt 179.6 lb

## 2024-11-27 DIAGNOSIS — Z114 Encounter for screening for human immunodeficiency virus [HIV]: Secondary | ICD-10-CM

## 2024-11-27 DIAGNOSIS — C8307 Small cell B-cell lymphoma, spleen: Principal | ICD-10-CM

## 2024-11-27 NOTE — Progress Notes [1]
 Name: Crystal Cameron          MRN: [id number]      DOB: [birth year]      AGE: 69 y.o.   DATE OF SERVICE: 11/27/2024    Subjective:             Reason for Visit:  Follow Up      Crystal Cameron is a 69 y.o. female.      Cancer Staging   Splenic marginal zone b-cell lymphoma (CMS-HCC)  Staging form: Hodgkin And Non-Hodgkin Lymphoma, AJCC 8th Edition  - Clinical stage from 05/07/2022: Stage IV (Marginal zone lymphoma) - Signed by Jesusa Charlie PARAS, MD on 05/07/2022      Crystal Cameron presents today for management of marginal zone lymphoma.    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  Summer 2022 presented with progressive splenomegaly, ribcage pain and progressive pancytopenia.  2.  06/09/2021 bone marrow biopsy showed 5-10% involvement with CD5(-) CD10(-) monoclonal B-cell infiltrate.  Cytogenetics were diploid.  3.  Rituximab  x 4 followed by 2 years of rituximab  maintenance completed September 2024.  4.  September 2025 noted to have some worsening of her splenomegaly.      On interview today, She feels well.  She continues to work [place] doing physical work.  She has been able to keep up  She notes some soreness in her LUQ.  Started on iron and notes that her energy level has improved.    I have reviewed and updated the past medical, social and family histories in the history section and they are up to date as of this visit.    I have extensively reviewed the laboratory, pathology and radiology, both internal and external, and the key findings are summarized above.       Review of Systems   All other systems reviewed and are negative.      Objective   Objective:          acetaminophen  (TYLENOL  EXTRA STRENGTH) 500 mg tablet Take one tablet by mouth as Needed for Pain. Max of 4,000 mg of acetaminophen  in 24 hours.    BIOTIN PO Take 6,000 mcg by mouth daily.    CALCIUM PO Take 1,200 mg by mouth daily.    ergocalciferol (vitamin D2) (VITAMIN D PO) Take 800 mg by mouth daily.    ferrous sulfate (IRON PO) Take 65 mg by mouth daily.    KRILL OIL PO Take 350 mg by mouth daily.    lutein 20 mg tab Take one tablet by mouth daily.    Multivitamins with Fluoride (MULTI-VITAMIN PO) Take  by mouth daily.    sertraline (ZOLOFT) 50 mg tablet Take one tablet by mouth daily.     Vitals:    11/27/24 1218   BP: (!) 151/80  Comment: Second read 141/65   BP Source: Arm, Left Upper   Pulse: 99   Temp: 36.4 ?C (97.6 ?F)   Resp: 16   SpO2: 100%   TempSrc: Temporal   PainSc: Zero   Weight: 81.5 kg (179 lb 9.6 oz)     Body mass index is 32.85 kg/m?Crystal Cameron     Pain Score: Zero       Fatigue Scale: 0-None    Pain Addressed:  N/A    Patient Evaluated for a Clinical Trial: Patient not eligible for a treatment trial (including not needing treatment, needs palliative care, in remission).     [place]  Cooperative Oncology Group performance status is 0, Fully active, able to carry on all pre-disease performance without restriction.Crystal Cameron     Physical Exam  Vitals and nursing note reviewed.   Constitutional:       General: She is not in acute distress.     Appearance: She is well-developed.   HENT:      Head: Normocephalic.   Eyes:      General: No scleral icterus.     Conjunctiva/sclera: Conjunctivae normal.   Cardiovascular:      Rate and Rhythm: Normal rate and regular rhythm.   Pulmonary:      Effort: Pulmonary effort is normal.   Abdominal:      Palpations: Abdomen is soft.   Musculoskeletal:      Cervical back: Neck supple.   Lymphadenopathy:      Comments:   Nontender spleen palpable on deep inspiration.  No palpable cervical, supraclavicular, axillary or inguinal adenopathy.   Skin:     Findings: No rash.   Neurological:      Mental Status: She is alert.                Assessment and Plan:      Splenic marginal zone b-cell lymphoma (CMS-HCC)    Impression:  Recurrent primary splenic marginal zone lymphoma  IgM kappa monoclonal gammopathy  Iron deficiency anemia  Depression  Osteopenia  ECOG PS 0    Plan:  I am seeing for the first time and had a detailed discussion with her and her husband regarding the natural history, biology, staging, and management of primary splenic marginal zone lymphoma.  We discussed that this is a subtype of indolent B-cell non-Hodgkin lymphoma with 2 distinct characteristics.  First, is not considered curable with conventional therapy.  Second, there is no clear benefit to early intervention.  Given these features, active surveillance is recommended for asymptomatic patients.  In her particular case, she presented with acute progressive symptoms and was discovered to have cytopenias and severely symptomatic splenomegaly at the time of diagnosis.  She was appropriately treated with rituximab  monotherapy followed by maintenance.  At this point she does have a low-volume relapse of her disease approximately 15 months after completion of rituximab  maintenance.  Her spleen is palpable today, but is nontender, and she does not have any significant pain or other symptoms related to her splenomegaly nor does she have any symptomatic cytopenias.  I discussed with her that at this point I would not recommend active therapy and I think that active surveillance is sufficient.  At the point that she requires treatment, single agent rituximab  would be reasonable, but I would prefer to give her mosunetuzumab to see if we can get a longer remission duration.  I do have a clinical trial that should be opening in the next 6 months or so with a mosunetuzumab backbone.  Patients are randomized to receive either mosunetuzumab plus polatuzumab vedotin or mosunetuzumab plus zanubrutinib.  She is potentially interested in participation if she requires therapy during the time studies open.  Continue active surveillance.  Check plasma cell labs next visit  Continue iron 1 tablet MWF  I will plan on seeing her back in about 3 months, or sooner should new or concerning issues arise.    I have discussed the diagnosis and treatment plan with the patient and she expresses understanding and wishes to proceed.

## 2024-11-28 ENCOUNTER — Encounter: Admit: 2024-11-28 | Discharge: 2024-11-28 | Payer: MEDICARE
